# Patient Record
Sex: Male | Born: 1960 | Race: Black or African American | Hispanic: No | Marital: Single | Smoking: Current every day smoker
Health system: Southern US, Community
[De-identification: ages and names within clinical notes are randomized; demographics above are authoritative.]

## PROBLEM LIST (undated history)

## (undated) DIAGNOSIS — Z86010 Personal history of colonic polyps: Secondary | ICD-10-CM

## (undated) DIAGNOSIS — K219 Gastro-esophageal reflux disease without esophagitis: Secondary | ICD-10-CM

## (undated) DIAGNOSIS — I10 Essential (primary) hypertension: Secondary | ICD-10-CM

## (undated) DIAGNOSIS — K5790 Diverticulosis of intestine, part unspecified, without perforation or abscess without bleeding: Secondary | ICD-10-CM

## (undated) DIAGNOSIS — F329 Major depressive disorder, single episode, unspecified: Secondary | ICD-10-CM

## (undated) DIAGNOSIS — F32A Depression, unspecified: Secondary | ICD-10-CM

## (undated) HISTORY — DX: Gastro-esophageal reflux disease without esophagitis: K21.9

## (undated) HISTORY — DX: Personal history of colonic polyps: Z86.010

## (undated) HISTORY — PX: APPENDECTOMY: SHX54

## (undated) HISTORY — PX: OTHER SURGICAL HISTORY: SHX169

## (undated) HISTORY — DX: Major depressive disorder, single episode, unspecified: F32.9

## (undated) HISTORY — DX: Essential (primary) hypertension: I10

## (undated) HISTORY — DX: Diverticulosis of intestine, part unspecified, without perforation or abscess without bleeding: K57.90

## (undated) HISTORY — PX: COLON SURGERY: SHX602

## (undated) HISTORY — DX: Depression, unspecified: F32.A

---

## 1998-10-04 ENCOUNTER — Ambulatory Visit (HOSPITAL_BASED_OUTPATIENT_CLINIC_OR_DEPARTMENT_OTHER): Admission: RE | Admit: 1998-10-04 | Discharge: 1998-10-04 | Payer: Self-pay | Admitting: Orthopedic Surgery

## 1998-10-25 ENCOUNTER — Ambulatory Visit (HOSPITAL_BASED_OUTPATIENT_CLINIC_OR_DEPARTMENT_OTHER): Admission: RE | Admit: 1998-10-25 | Discharge: 1998-10-25 | Payer: Self-pay | Admitting: Orthopedic Surgery

## 1998-12-13 ENCOUNTER — Emergency Department (HOSPITAL_COMMUNITY): Admission: EM | Admit: 1998-12-13 | Discharge: 1998-12-13 | Payer: Self-pay | Admitting: Emergency Medicine

## 1999-08-01 ENCOUNTER — Other Ambulatory Visit: Admission: RE | Admit: 1999-08-01 | Discharge: 1999-08-01 | Payer: Self-pay | Admitting: *Deleted

## 1999-08-01 ENCOUNTER — Encounter (INDEPENDENT_AMBULATORY_CARE_PROVIDER_SITE_OTHER): Payer: Self-pay | Admitting: Specialist

## 2001-06-20 ENCOUNTER — Inpatient Hospital Stay (HOSPITAL_COMMUNITY): Admission: EM | Admit: 2001-06-20 | Discharge: 2001-06-27 | Payer: Self-pay | Admitting: Emergency Medicine

## 2001-06-20 ENCOUNTER — Encounter (INDEPENDENT_AMBULATORY_CARE_PROVIDER_SITE_OTHER): Payer: Self-pay | Admitting: *Deleted

## 2001-09-11 ENCOUNTER — Ambulatory Visit (HOSPITAL_COMMUNITY): Admission: RE | Admit: 2001-09-11 | Discharge: 2001-09-11 | Payer: Self-pay | Admitting: Surgery

## 2001-09-11 ENCOUNTER — Encounter: Payer: Self-pay | Admitting: Surgery

## 2001-10-07 ENCOUNTER — Encounter: Payer: Self-pay | Admitting: Surgery

## 2001-10-07 ENCOUNTER — Encounter: Admission: RE | Admit: 2001-10-07 | Discharge: 2001-10-25 | Payer: Self-pay | Admitting: Surgery

## 2001-10-14 ENCOUNTER — Inpatient Hospital Stay (HOSPITAL_COMMUNITY): Admission: RE | Admit: 2001-10-14 | Discharge: 2001-10-17 | Payer: Self-pay | Admitting: Surgery

## 2001-10-14 ENCOUNTER — Encounter (INDEPENDENT_AMBULATORY_CARE_PROVIDER_SITE_OTHER): Payer: Self-pay | Admitting: *Deleted

## 2003-06-08 ENCOUNTER — Emergency Department (HOSPITAL_COMMUNITY): Admission: EM | Admit: 2003-06-08 | Discharge: 2003-06-08 | Payer: Self-pay | Admitting: Emergency Medicine

## 2004-04-24 ENCOUNTER — Ambulatory Visit: Payer: Self-pay | Admitting: Internal Medicine

## 2004-04-30 ENCOUNTER — Ambulatory Visit: Payer: Self-pay | Admitting: Internal Medicine

## 2004-05-01 ENCOUNTER — Ambulatory Visit: Payer: Self-pay | Admitting: *Deleted

## 2004-05-16 ENCOUNTER — Ambulatory Visit: Payer: Self-pay | Admitting: Internal Medicine

## 2004-08-06 ENCOUNTER — Ambulatory Visit: Payer: Self-pay | Admitting: Internal Medicine

## 2004-09-19 ENCOUNTER — Ambulatory Visit: Payer: Self-pay | Admitting: Internal Medicine

## 2004-10-24 ENCOUNTER — Ambulatory Visit: Payer: Self-pay | Admitting: Internal Medicine

## 2005-04-14 ENCOUNTER — Encounter (INDEPENDENT_AMBULATORY_CARE_PROVIDER_SITE_OTHER): Payer: Self-pay | Admitting: Internal Medicine

## 2005-04-14 LAB — CONVERTED CEMR LAB: PSA: 0.36 ng/mL

## 2005-05-06 ENCOUNTER — Ambulatory Visit: Payer: Self-pay | Admitting: Internal Medicine

## 2005-06-04 ENCOUNTER — Ambulatory Visit: Payer: Self-pay | Admitting: Internal Medicine

## 2006-11-22 ENCOUNTER — Encounter (INDEPENDENT_AMBULATORY_CARE_PROVIDER_SITE_OTHER): Payer: Self-pay | Admitting: Internal Medicine

## 2006-11-22 DIAGNOSIS — Z8719 Personal history of other diseases of the digestive system: Secondary | ICD-10-CM

## 2006-11-22 DIAGNOSIS — K219 Gastro-esophageal reflux disease without esophagitis: Secondary | ICD-10-CM

## 2006-11-22 DIAGNOSIS — F17200 Nicotine dependence, unspecified, uncomplicated: Secondary | ICD-10-CM | POA: Insufficient documentation

## 2006-11-22 DIAGNOSIS — J309 Allergic rhinitis, unspecified: Secondary | ICD-10-CM | POA: Insufficient documentation

## 2006-11-22 DIAGNOSIS — F329 Major depressive disorder, single episode, unspecified: Secondary | ICD-10-CM

## 2007-12-04 ENCOUNTER — Ambulatory Visit: Payer: Self-pay | Admitting: Internal Medicine

## 2007-12-04 DIAGNOSIS — E782 Mixed hyperlipidemia: Secondary | ICD-10-CM

## 2007-12-04 DIAGNOSIS — R030 Elevated blood-pressure reading, without diagnosis of hypertension: Secondary | ICD-10-CM

## 2007-12-18 ENCOUNTER — Ambulatory Visit: Payer: Self-pay | Admitting: Internal Medicine

## 2007-12-18 LAB — CONVERTED CEMR LAB
Albumin: 4.5 g/dL (ref 3.5–5.2)
Alkaline Phosphatase: 44 units/L (ref 39–117)
BUN: 17 mg/dL (ref 6–23)
CO2: 22 meq/L (ref 19–32)
Calcium: 9.6 mg/dL (ref 8.4–10.5)
Cholesterol: 161 mg/dL (ref 0–200)
Eosinophils Absolute: 0.2 10*3/uL (ref 0.0–0.7)
Eosinophils Relative: 3 % (ref 0–5)
Glucose, Bld: 73 mg/dL (ref 70–99)
HCT: 46.4 % (ref 39.0–52.0)
HDL: 58 mg/dL (ref >39)
LDL Cholesterol: 91 mg/dL (ref 0–99)
Lymphocytes Relative: 37 % (ref 12–46)
Lymphs Abs: 2.6 10*3/uL (ref 0.7–4.0)
MCV: 97.5 fL (ref 78.0–100.0)
Monocytes Relative: 10 % (ref 3–12)
Neutrophils Relative %: 49 % (ref 43–77)
Potassium: 4.8 meq/L (ref 3.5–5.3)
RBC: 4.76 M/uL (ref 4.22–5.81)
Total Protein: 7 g/dL (ref 6.0–8.3)
Triglycerides: 61 mg/dL (ref <150)
WBC: 7.1 10*3/uL (ref 4.0–10.5)

## 2007-12-29 ENCOUNTER — Encounter (INDEPENDENT_AMBULATORY_CARE_PROVIDER_SITE_OTHER): Payer: Self-pay | Admitting: Internal Medicine

## 2008-01-11 ENCOUNTER — Telehealth (INDEPENDENT_AMBULATORY_CARE_PROVIDER_SITE_OTHER): Payer: Self-pay | Admitting: Internal Medicine

## 2008-01-21 ENCOUNTER — Ambulatory Visit: Payer: Self-pay | Admitting: Internal Medicine

## 2008-01-21 DIAGNOSIS — K5909 Other constipation: Secondary | ICD-10-CM | POA: Insufficient documentation

## 2008-01-21 DIAGNOSIS — F528 Other sexual dysfunction not due to a substance or known physiological condition: Secondary | ICD-10-CM

## 2008-01-21 LAB — CONVERTED CEMR LAB
Blood in Urine, dipstick: NEGATIVE
Ketones, urine, test strip: NEGATIVE
Nitrite: NEGATIVE
Protein, U semiquant: NEGATIVE
Urobilinogen, UA: 0.2
WBC Urine, dipstick: NEGATIVE

## 2008-02-03 ENCOUNTER — Telehealth (INDEPENDENT_AMBULATORY_CARE_PROVIDER_SITE_OTHER): Payer: Self-pay | Admitting: Internal Medicine

## 2008-03-08 ENCOUNTER — Telehealth (INDEPENDENT_AMBULATORY_CARE_PROVIDER_SITE_OTHER): Payer: Self-pay | Admitting: Internal Medicine

## 2008-03-22 ENCOUNTER — Ambulatory Visit: Payer: Self-pay | Admitting: Internal Medicine

## 2008-03-22 DIAGNOSIS — K089 Disorder of teeth and supporting structures, unspecified: Secondary | ICD-10-CM | POA: Insufficient documentation

## 2008-03-25 ENCOUNTER — Ambulatory Visit: Payer: Self-pay | Admitting: Internal Medicine

## 2008-04-15 ENCOUNTER — Telehealth (INDEPENDENT_AMBULATORY_CARE_PROVIDER_SITE_OTHER): Payer: Self-pay | Admitting: Internal Medicine

## 2008-05-12 ENCOUNTER — Ambulatory Visit: Payer: Self-pay | Admitting: Internal Medicine

## 2008-07-05 ENCOUNTER — Ambulatory Visit: Payer: Self-pay | Admitting: Internal Medicine

## 2008-10-06 ENCOUNTER — Telehealth (INDEPENDENT_AMBULATORY_CARE_PROVIDER_SITE_OTHER): Payer: Self-pay | Admitting: Internal Medicine

## 2008-10-11 ENCOUNTER — Telehealth (INDEPENDENT_AMBULATORY_CARE_PROVIDER_SITE_OTHER): Payer: Self-pay | Admitting: Internal Medicine

## 2008-10-11 ENCOUNTER — Emergency Department (HOSPITAL_COMMUNITY): Admission: EM | Admit: 2008-10-11 | Discharge: 2008-10-11 | Payer: Self-pay | Admitting: Emergency Medicine

## 2008-11-16 ENCOUNTER — Ambulatory Visit: Payer: Self-pay | Admitting: Internal Medicine

## 2008-11-16 DIAGNOSIS — H9209 Otalgia, unspecified ear: Secondary | ICD-10-CM | POA: Insufficient documentation

## 2008-11-18 ENCOUNTER — Telehealth (INDEPENDENT_AMBULATORY_CARE_PROVIDER_SITE_OTHER): Payer: Self-pay | Admitting: Internal Medicine

## 2008-12-02 ENCOUNTER — Ambulatory Visit: Payer: Self-pay | Admitting: Internal Medicine

## 2008-12-09 ENCOUNTER — Encounter (INDEPENDENT_AMBULATORY_CARE_PROVIDER_SITE_OTHER): Payer: Self-pay | Admitting: Internal Medicine

## 2009-02-01 ENCOUNTER — Ambulatory Visit: Payer: Self-pay | Admitting: Internal Medicine

## 2009-02-01 LAB — CONVERTED CEMR LAB
ALT: 15 units/L (ref 0–53)
Alkaline Phosphatase: 43 units/L (ref 39–117)
Basophils Absolute: 0.1 10*3/uL (ref 0.0–0.1)
Basophils Relative: 1 % (ref 0–1)
CO2: 20 meq/L (ref 19–32)
Cholesterol: 146 mg/dL (ref 0–200)
Creatinine, Ser: 1.19 mg/dL (ref 0.40–1.50)
Eosinophils Absolute: 0.1 10*3/uL (ref 0.0–0.7)
Glucose, Bld: 93 mg/dL (ref 70–99)
LDL Cholesterol: 73 mg/dL (ref 0–99)
MCHC: 33.8 g/dL (ref 30.0–36.0)
MCV: 96.8 fL (ref 78.0–100.0)
Monocytes Relative: 7 % (ref 3–12)
Neutro Abs: 3.8 10*3/uL (ref 1.7–7.7)
Neutrophils Relative %: 57 % (ref 43–77)
Nitrite: NEGATIVE
Protein, U semiquant: NEGATIVE
RBC: 4.7 M/uL (ref 4.22–5.81)
RDW: 13.6 % (ref 11.5–15.5)
Sodium: 142 meq/L (ref 135–145)
Total Bilirubin: 0.5 mg/dL (ref 0.3–1.2)
Total CHOL/HDL Ratio: 2.9
Triglycerides: 111 mg/dL (ref <150)
Urobilinogen, UA: 0.2
VLDL: 22 mg/dL (ref 0–40)
WBC Urine, dipstick: NEGATIVE
pH: 5.5

## 2009-02-14 ENCOUNTER — Encounter (INDEPENDENT_AMBULATORY_CARE_PROVIDER_SITE_OTHER): Payer: Self-pay | Admitting: Internal Medicine

## 2009-03-30 ENCOUNTER — Telehealth (INDEPENDENT_AMBULATORY_CARE_PROVIDER_SITE_OTHER): Payer: Self-pay | Admitting: Internal Medicine

## 2009-04-04 ENCOUNTER — Ambulatory Visit: Payer: Self-pay | Admitting: Internal Medicine

## 2009-04-18 ENCOUNTER — Telehealth (INDEPENDENT_AMBULATORY_CARE_PROVIDER_SITE_OTHER): Payer: Self-pay | Admitting: Internal Medicine

## 2009-04-20 ENCOUNTER — Ambulatory Visit: Payer: Self-pay | Admitting: Internal Medicine

## 2009-11-15 ENCOUNTER — Ambulatory Visit: Payer: Self-pay | Admitting: Internal Medicine

## 2009-11-15 DIAGNOSIS — R5381 Other malaise: Secondary | ICD-10-CM

## 2009-11-15 DIAGNOSIS — R5383 Other fatigue: Secondary | ICD-10-CM

## 2009-11-15 LAB — CONVERTED CEMR LAB
Blood in Urine, dipstick: NEGATIVE
Nitrite: NEGATIVE
Protein, U semiquant: NEGATIVE
Specific Gravity, Urine: 1.025
Urobilinogen, UA: 0.2

## 2009-11-23 ENCOUNTER — Telehealth (INDEPENDENT_AMBULATORY_CARE_PROVIDER_SITE_OTHER): Payer: Self-pay | Admitting: Internal Medicine

## 2009-11-23 LAB — CONVERTED CEMR LAB
BUN: 25 mg/dL — ABNORMAL HIGH (ref 6–23)
Basophils Relative: 1 % (ref 0–1)
CO2: 21 meq/L (ref 19–32)
Eosinophils Absolute: 0.1 10*3/uL (ref 0.0–0.7)
Eosinophils Relative: 1 % (ref 0–5)
Glucose, Bld: 79 mg/dL (ref 70–99)
HCT: 48.5 % (ref 39.0–52.0)
Hemoglobin: 16.8 g/dL (ref 13.0–17.0)
MCHC: 34.6 g/dL (ref 30.0–36.0)
MCV: 96.4 fL (ref 78.0–100.0)
Monocytes Absolute: 0.7 10*3/uL (ref 0.1–1.0)
Monocytes Relative: 9 % (ref 3–12)
Neutro Abs: 4.4 10*3/uL (ref 1.7–7.7)
RBC: 5.03 M/uL (ref 4.22–5.81)
Sodium: 137 meq/L (ref 135–145)
Testosterone: 284.69 ng/dL — ABNORMAL LOW (ref 350–890)
Total Bilirubin: 0.4 mg/dL (ref 0.3–1.2)
Total Protein: 7.2 g/dL (ref 6.0–8.3)

## 2009-11-24 ENCOUNTER — Telehealth (INDEPENDENT_AMBULATORY_CARE_PROVIDER_SITE_OTHER): Payer: Self-pay | Admitting: Internal Medicine

## 2009-12-01 ENCOUNTER — Telehealth (INDEPENDENT_AMBULATORY_CARE_PROVIDER_SITE_OTHER): Payer: Self-pay | Admitting: Internal Medicine

## 2010-01-30 ENCOUNTER — Ambulatory Visit: Payer: Self-pay | Admitting: Internal Medicine

## 2010-03-17 ENCOUNTER — Emergency Department (HOSPITAL_COMMUNITY): Admission: EM | Admit: 2010-03-17 | Discharge: 2010-03-18 | Payer: Self-pay | Admitting: Emergency Medicine

## 2010-03-22 ENCOUNTER — Telehealth (INDEPENDENT_AMBULATORY_CARE_PROVIDER_SITE_OTHER): Payer: Self-pay | Admitting: Internal Medicine

## 2010-04-02 ENCOUNTER — Ambulatory Visit: Payer: Self-pay | Admitting: Internal Medicine

## 2010-04-02 DIAGNOSIS — H6690 Otitis media, unspecified, unspecified ear: Secondary | ICD-10-CM | POA: Insufficient documentation

## 2010-04-09 ENCOUNTER — Ambulatory Visit: Payer: Self-pay | Admitting: Internal Medicine

## 2010-04-17 ENCOUNTER — Ambulatory Visit: Payer: Self-pay | Admitting: Nurse Practitioner

## 2010-04-17 ENCOUNTER — Telehealth (INDEPENDENT_AMBULATORY_CARE_PROVIDER_SITE_OTHER): Payer: Self-pay | Admitting: Internal Medicine

## 2010-04-23 ENCOUNTER — Encounter (INDEPENDENT_AMBULATORY_CARE_PROVIDER_SITE_OTHER): Payer: Self-pay | Admitting: Internal Medicine

## 2010-05-03 ENCOUNTER — Telehealth (INDEPENDENT_AMBULATORY_CARE_PROVIDER_SITE_OTHER): Payer: Self-pay | Admitting: Internal Medicine

## 2010-05-30 ENCOUNTER — Ambulatory Visit: Payer: Self-pay | Admitting: Internal Medicine

## 2010-05-30 LAB — CONVERTED CEMR LAB
Basophils Absolute: 0.1 10*3/uL (ref 0.0–0.1)
Cholesterol: 146 mg/dL (ref 0–200)
HDL: 45 mg/dL (ref >39)
Hemoglobin: 14.2 g/dL (ref 13.0–17.0)
Lymphocytes Relative: 36 % (ref 12–46)
Monocytes Absolute: 0.5 10*3/uL (ref 0.1–1.0)
Neutro Abs: 3.8 10*3/uL (ref 1.7–7.7)
Platelets: 240 10*3/uL (ref 150–400)
RDW: 14.4 % (ref 11.5–15.5)
Total CHOL/HDL Ratio: 3.2

## 2010-06-13 ENCOUNTER — Telehealth (INDEPENDENT_AMBULATORY_CARE_PROVIDER_SITE_OTHER): Payer: Self-pay | Admitting: Internal Medicine

## 2010-06-19 ENCOUNTER — Encounter (INDEPENDENT_AMBULATORY_CARE_PROVIDER_SITE_OTHER): Payer: Self-pay | Admitting: *Deleted

## 2010-07-23 ENCOUNTER — Ambulatory Visit: Admit: 2010-07-23 | Payer: Self-pay | Admitting: Internal Medicine

## 2010-08-14 NOTE — Progress Notes (Signed)
Summary: Wants colonoscopy referral  Phone Note Call from Patient   Summary of Call: Would like a referral for a colonoscopy. Initial call taken by: Dutch Quint RN,  April 17, 2010 12:21 PM  Follow-up for Phone Call        Nora--see CPE in July and referral letter to send. Follow-up by: Julieanne Manson MD,  April 23, 2010 10:11 AM  Additional Follow-up for Phone Call Additional follow up Details #1::        I SEND THE REFERRAL TO [facility] WAITING FOR AN APPT  Additional Follow-up by: Cheryll Dessert,  April 26, 2010 8:29 AM

## 2010-08-14 NOTE — Assessment & Plan Note (Signed)
Summary: FU FOR RIGHT EAR///[place]  Nurse Visit   Vital Signs:  Patient profile:   49 year old male Pulse rate:   96 / minute Pulse rhythm:   regular Resp:     20 per minute BP sitting:   110 / 84  (left arm) Cuff size:   regular  Vitals Entered By: Dutch Quint RN (April 17, 2010 10:07 AM)  Patient Instructions: 1)  Seen by Dr. Delrae Alfred. 2)  Your inner ears are very dry, but there is no infection. 3)  Use a 50-50 mixture of white vinegar and rubbing alcohol and take 4 drops and put it in your ear canals.  After it's been in for a couple of minutes, let it drain out. 4)  Don't put anything in your ears!! 5)  The fluid in your ear will slowly go away.  Your ears look much better. 6)  Call if anything changes or if you have questions.   Review of Systems ENT:  Complains of decreased hearing; denies difficulty swallowing, ear discharge, earache, hoarseness, nasal congestion, nosebleeds, postnasal drainage, ringing in ears, sinus pressure, and sore throat.   Physical Exam  Ears:  right ear still has some erythema to canal and TM, some residual fluid -- TM is visualized better since last visit.   L ear normal.     Impression & Recommendations:  Problem # 1:  OTITIS MEDIA, ACUTE, RIGHT (ICD-382.9) Right ear looks much better, left ear normal Completed antibiotics, still taking allergy meds Instructed not to put things in his ear -- to use 50/50 mixture of white vinegar and rubbing alcohol, 4 drops in each ear and let it drain.  His updated medication list for this problem includes:    Ibuprofen 800 Mg Tabs (Ibuprofen) .Marland Kitchen... Take one by mouth with food every 8 hours as needed for pain  Complete Medication List: 1)  Amitriptyline Hcl 50 Mg Tabs (Amitriptyline hcl) .Marland Kitchen.. 1 tab by mouth q hs 2)  Protonix 40 Mg Tbec (Pantoprazole sodium) .Marland Kitchen.. 1tpoqd for esophageal reflux 3)  Miralax Powd (Polyethylene glycol 3350) .Marland KitchenMarland KitchenMarland Kitchen 17 g in 8 oz fluid daily--may decrease frequency of use and  amount used if stools become loose or watery 4)  Nasacort Aq 55 Mcg/act Aers (Triamcinolone acetonide(nasal)) .... 2 sprays each nostril daily 5)  Singulair 10 Mg Tabs (Montelukast sodium) .Marland Kitchen.. 1 tab by mouth daily 6)  Multivitamins Tabs (Multiple vitamin) .Marland Kitchen.. 1 tab by mouth daily 7)  Xyzal 5 Mg Tabs (Levocetirizine dihydrochloride) .Marland Kitchen.. 1 tab by mouth daily 8)  Astelin 137 Mcg/spray Soln (Azelastine hcl) .... 2 sprays each nostril two times a day 9)  Viagra 100 Mg Tabs (Sildenafil citrate) .Marland Kitchen.. 1 tab 1 hour before activity as needed.  do not exceed one tablet in 24 hour period. 10)  Androgel 50 Mg/5gm Gel (Testosterone) .... 5g to shoulders, upper arms or abdomen changed daily 11)  Ibuprofen 800 Mg Tabs (Ibuprofen) .... Take one by mouth with food every 8 hours as needed for pain  CC: R ear check s/p infection Is Patient Diabetic? No Pain Assessment Patient in pain? no        Primary Care Provider:  Tereso Newcomer, PA-C  CC:  R ear check s/p infection.  History of Present Illness: In office 9/19 for right ear infection, has been on antibiotics and allergy medication.  Still feeling like right ear is still clogged.  Still taking allergy medication.   Allergies: No Known Drug Allergies  Orders Added: 1)  Est. Patient Level II [04540]

## 2010-08-14 NOTE — Progress Notes (Signed)
Summary: Query for meds, wants lab results  Phone Note Call from Patient Call back at Home Phone [phone] Call back at [phone]   Call For: [phone] Summary of Call: [facility] do not carry astelin and the pt wants to know if the provider can prescribed something else that our pharmacy has because he doesn't has the money to pay outside pharmacy. Also the pt wants to know if the provider can mail his lab results. Jason Marucci MD Initial call taken by: Manon Hilding,  Dec 01, 2009 9:14 AM  Follow-up for Phone Call        He already receives the Nasocort, Xyzal, and Singulair--if we do not carry the Astelin, not another med to offer. PLease  send lab results--just print and send to patient Follow-up by: Julieanne Manson MD,  Dec 04, 2009 8:56 AM  Additional Follow-up for Phone Call Additional follow up Details #1::        Spoke with pt. and advised of provider's response.  Address confirmed -- lab results mailed to pt.  Additional Follow-up by: Dutch Quint RN,  Dec 07, 2009 11:44 AM

## 2010-08-14 NOTE — Progress Notes (Signed)
Summary: Office Visit//DEPRESSION SCREENING  Office Visit//DEPRESSION SCREENING   Imported By: Arta Bruce 01/11/2010 12:37:46  _____________________________________________________________________  External Attachment:    Type:   Image     Comment:   External Document

## 2010-08-14 NOTE — Progress Notes (Signed)
Summary: GI REFERRAL   Phone Note Outgoing Call   Summary of Call: I JUST WANT TO LET YOU KNOW THATYOUR PT WAS REFERRAL TO  TO GI  AND [facility] IS ON CALL THESE MONTH  I CALL TO SCHEDULE AN APPT PT HAVE A BALANCE WITH THEM SO I SEND TO [facility] AND CAN'T BE SEEN THERE BECAUSE THEY DON'T HAVE A MEDICAL NECCESITY IS JUST A PREVENTIVE HEALTH CARE AND MY NEXT STEP IS WAITING NEXT MONTH THAT [facility] WILL BE ON CALL TO TRY TO SCHEDULE THANK YOU  Initial call taken by: Cheryll Dessert,  May 03, 2010 3:42 PM  Follow-up for Phone Call        that's fine Follow-up by: Julieanne Manson MD,  May 03, 2010 11:39 PM

## 2010-08-14 NOTE — Letter (Signed)
Summary: Pre Visit Letter Revised  [facility]  [address] [place], [place] [address]   Phone: [phone]  Fax: [phone]        06/19/2010 MRN: [id number] Jason Fritz [address] [place], [place]  [address]             Procedure Date:  08-06-10   Welcome to the Gastroenterology Division at [facility].    You are scheduled to see a nurse for your pre-procedure visit on 07-23-10 at 11:00a.m. on the 3rd floor at [facility], [address]  We ask that you try to arrive at our office 15 minutes prior to your appointment time to allow for check-in.  Please take a minute to review the attached form.  If you answer "Yes" to one or more of the questions on the first page, we ask that you call the person listed at your earliest opportunity.  If you answer "No" to all of the questions, please complete the rest of the form and bring it to your appointment.    Your nurse visit will consist of discussing your medical and surgical history, your immediate family medical history, and your medications.   If you are unable to list all of your medications on the form, please bring the medication bottles to your appointment and we will list them.  We will need to be aware of both prescribed and over the counter drugs.  We will need to know exact dosage information as well.    Please be prepared to read and sign documents such as consent forms, a financial agreement, and acknowledgement forms.  If necessary, and with your consent, a friend or relative is welcome to sit-in on the nurse visit with you.  Please bring your insurance card so that we may make a copy of it.  If your insurance requires a referral to see a specialist, please bring your referral form from your primary care physician.  No co-pay is required for this nurse visit.     If you cannot keep your appointment, please call [phone] to cancel or reschedule prior to your appointment date.  This allows  [place] the opportunity to schedule an appointment for another patient in need of care.    Thank you for choosing [facility] for your medical needs.  We appreciate the opportunity to care for you.  Please visit [place] at our website  to learn more about our practice.  Sincerely, The Gastroenterology Division

## 2010-08-14 NOTE — Progress Notes (Signed)
  Phone Note Other Incoming   Summary of Call: No Astelin available at [facility] Initial call taken by: Julieanne Manson MD,  Nov 23, 2009 7:26 AM

## 2010-08-14 NOTE — Letter (Signed)
Summary: *Referral Letter  [facility]  [address] [place], [place] [address]   Phone: [phone]  Fax: [phone]    04/23/2010  Thank you in advance for agreeing to see my patient:  Jason Fritz [address] [place], [place]  [address]  Phone: [phone]  Reason for Referral: Pt recently turned 49.  Will enclose CPE from July with this letter.  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) 3)  FATIGUE (ICD-780.79) 4)  EAR PAIN, RIGHT (ICD-388.70) 5)  DENTAL DISORDER (ICD-525.9) 6)  CONSTIPATION, CHRONIC (ICD-564.09) 7)  ERECTILE DYSFUNCTION (ICD-302.72) 8)  HEALTH MAINTENANCE EXAM (ICD-V70.0) 9)  HYPERLIPIDEMIA, MIXED (ICD-272.2) 10)  ELEVATED BLOOD PRESSURE WITHOUT DIAGNOSIS OF HYPERTENSION (ICD-796.2) 11)  TOBACCO USER (ICD-305.1) 12)  GERD (ICD-530.81) 13)  DIVERTICULITIS, HX OF (ICD-V12.79) 14)  DEPRESSION (ICD-311) 15)  ALLERGIC RHINITIS (ICD-477.9)   Current Medications: 1)  AMITRIPTYLINE HCL 50 MG TABS (AMITRIPTYLINE HCL) 1 tab by mouth q hs 2)  PROTONIX 40 MG TBEC (PANTOPRAZOLE SODIUM) 1tpoqd for esophageal reflux 3)  MIRALAX   POWD (POLYETHYLENE GLYCOL 3350) 17 g in 8 oz fluid daily--may decrease frequency of use and amount used if stools become loose or watery 4)  NASACORT AQ 55 MCG/ACT AERS (TRIAMCINOLONE ACETONIDE(NASAL)) 2 sprays each nostril daily 5)  SINGULAIR 10 MG TABS (MONTELUKAST SODIUM) 1 tab by mouth daily 6)  MULTIVITAMINS  TABS (MULTIPLE VITAMIN) 1 tab by mouth daily 7)  XYZAL 5 MG TABS (LEVOCETIRIZINE DIHYDROCHLORIDE) 1 tab by mouth daily 8)  ASTELIN 137 MCG/SPRAY SOLN (AZELASTINE HCL) 2 sprays each nostril two times a day 9)  VIAGRA 100 MG TABS (SILDENAFIL CITRATE) 1 tab 1 hour before activity as needed.  Do not exceed one tablet in 24 hour period. 10)  ANDROGEL 50 MG/5GM GEL (TESTOSTERONE) 5g  to shoulders, upper arms or abdomen changed daily 11)  IBUPROFEN 800 MG TABS (IBUPROFEN) Take one by mouth with food every 8 hours as needed for pain   Past Medical History: 1)  EAR PAIN, RIGHT (ICD-388.70) 2)  DENTAL DISORDER (ICD-525.9) 3)  CONSTIPATION, CHRONIC (ICD-564.09) 4)  ERECTILE DYSFUNCTION (ICD-302.72) 5)  HEALTH MAINTENANCE EXAM (ICD-V70.0) 6)  HYPERLIPIDEMIA, MIXED (ICD-272.2) 7)  ELEVATED BLOOD PRESSURE WITHOUT DIAGNOSIS OF HYPERTENSION (ICD-796.2) 8)  TOBACCO USER (ICD-305.1) 9)  GERD (ICD-530.81) 10)  DIVERTICULITIS, HX OF (ICD-V12.79) 11)  DEPRESSION (ICD-311) 12)  ALLERGIC RHINITIS (ICD-477.9) 13)      Prior History of Blood Transfusions:   Pertinent Labs:    Thank you again for agreeing to see our patient; please contact us if you have any further questions or need additional information.  Sincerely,  Julieanne Manson MD

## 2010-08-14 NOTE — Assessment & Plan Note (Signed)
Summary: Right Otitis Media   Vital Signs:  Patient profile:   50 year old male Height:      69.75 inches Weight:      175 pounds BMI:     25.38 Temp:     98.4 degrees F oral Pulse rate:   88 / minute Pulse rhythm:   regular Resp:     18 per minute BP sitting:   140 / 96  (left arm) Cuff size:   large  Vitals Entered By: [place] Shannon (April 02, 2010 3:19 PM) CC: pt is here for ear pain.. pt says his left ear was hurting first and now its his right ear... Is Patient Diabetic? No Pain Assessment Patient in pain? no       Does patient need assistance? Functional Status Self care Ambulation Normal   Primary Care Provider:  Tereso Newcomer, PA-C  CC:  pt is here for ear pain.. pt says his left ear was hurting first and now its his right ear....  History of Present Illness: C/o ear pain.  Went to ED for left ear.  Pain in left feels better.  Now has right ear pain.  Used home remedy of vinegar/isopropyl alcohol and seemed to get better on our suggestion.  Left ear all better now.  Right ear pain started yesterday.  No fever.  Notes nasal congestion.  No sore throat.  No cough.  No dypsnea.  No vomiting or diarrhea.  Pain is quite severe.   Problems Prior to Update: 1)  Low Testosterone  (ICD-257.2) 2)  Fatigue  (ICD-780.79) 3)  Ear Pain, Right  (ICD-388.70) 4)  Dental Disorder  (ICD-525.9) 5)  Constipation, Chronic  (ICD-564.09) 6)  Erectile Dysfunction  (ICD-302.72) 7)  Health Maintenance Exam  (ICD-V70.0) 8)  Hyperlipidemia, Mixed  (ICD-272.2) 9)  Elevated Blood Pressure Without Diagnosis of Hypertension  (ICD-796.2) 10)  Tobacco User  (ICD-305.1) 11)  Gerd  (ICD-530.81) 12)  Diverticulitis, Hx of  (ICD-V12.79) 13)  Depression  (ICD-311) 14)  Allergic Rhinitis  (ICD-477.9)  Current Medications (verified): 1)  Amitriptyline Hcl 50 Mg Tabs (Amitriptyline Hcl) .Marland Kitchen.. 1 Tab By Mouth Q Hs 2)  Protonix 40 Mg Tbec (Pantoprazole Sodium) .Marland Kitchen.. 1tpoqd For Esophageal  Reflux 3)  Miralax   Powd (Polyethylene Glycol 3350) .Marland KitchenMarland KitchenMarland Kitchen 17 G in 8 Oz Fluid Daily--May Decrease Frequency of Use and Amount Used If Stools Become Loose or Watery 4)  Nasacort Aq 55 Mcg/act Aers (Triamcinolone Acetonide(Nasal)) .... 2 Sprays Each Nostril Daily 5)  Singulair 10 Mg Tabs (Montelukast Sodium) .Marland Kitchen.. 1 Tab By Mouth Daily 6)  Multivitamins  Tabs (Multiple Vitamin) .Marland Kitchen.. 1 Tab By Mouth Daily 7)  Xyzal 5 Mg Tabs (Levocetirizine Dihydrochloride) .Marland Kitchen.. 1 Tab By Mouth Daily 8)  Astelin 137 Mcg/spray Soln (Azelastine Hcl) .... 2 Sprays Each Nostril Two Times A Day 9)  Viagra 100 Mg Tabs (Sildenafil Citrate) .Marland Kitchen.. 1 Tab 1 Hour Before Activity As Needed.  Do Not Exceed One Tablet in 24 Hour Period. 10)  Androgel 50 Mg/5gm Gel (Testosterone) .... 5g To Shoulders, Upper Arms or Abdomen Changed Daily  Allergies (verified): No Known Drug Allergies  Past History:  Past Medical History: Last updated: 11/15/2009 EAR PAIN, RIGHT (ICD-388.70) DENTAL DISORDER (ICD-525.9) CONSTIPATION, CHRONIC (ICD-564.09) ERECTILE DYSFUNCTION (ICD-302.72) HEALTH MAINTENANCE EXAM (ICD-V70.0) HYPERLIPIDEMIA, MIXED (ICD-272.2) ELEVATED BLOOD PRESSURE WITHOUT DIAGNOSIS OF HYPERTENSION (ICD-796.2) TOBACCO USER (ICD-305.1) GERD (ICD-530.81) DIVERTICULITIS, HX OF (ICD-V12.79) DEPRESSION (ICD-311) ALLERGIC RHINITIS (ICD-477.9)    Physical Exam  General:  alert, well-developed, and  well-nourished.   Head:  normocephalic and atraumatic.   Eyes:  pupils equal, pupils round, pupils reactive to light, and no injection.   Ears:  L TM with some scarring; retracted and translucent R TM - bulging, yellow, boggy no pain with pressure over tragus bilat canals appear normal bilat Nose:  no external deformity.   Mouth:  pharynx pink and moist and no exudates.   Lungs:  normal breath sounds.   Heart:  normal rate and regular rhythm.   Neurologic:  alert & oriented X3 and cranial nerves II-XII intact.   Cervical Nodes:  no  anterior cervical adenopathy and no posterior cervical adenopathy.   does have tenderness over preauricular and post auricular nodes on right Psych:  normally interactive.     Impression & Recommendations:  Problem # 1:  OTITIS MEDIA, ACUTE, RIGHT (ICD-382.9)  looks pretty severe augmentin x 10 days continue nasal steroid afrin x 3 days ibuprofen  f/u in one week  His updated medication list for this problem includes:    Augmentin 875-125 Mg Tabs (Amoxicillin-pot clavulanate) .Marland Kitchen... Take 1 tablet by mouth two times a day for 10 days    Ibuprofen 800 Mg Tabs (Ibuprofen) .Marland Kitchen... Take one by mouth with food every 8 hours as needed for pain  Complete Medication List: 1)  Amitriptyline Hcl 50 Mg Tabs (Amitriptyline hcl) .Marland Kitchen.. 1 tab by mouth q hs 2)  Protonix 40 Mg Tbec (Pantoprazole sodium) .Marland Kitchen.. 1tpoqd for esophageal reflux 3)  Miralax Powd (Polyethylene glycol 3350) .Marland KitchenMarland KitchenMarland Kitchen 17 g in 8 oz fluid daily--may decrease frequency of use and amount used if stools become loose or watery 4)  Nasacort Aq 55 Mcg/act Aers (Triamcinolone acetonide(nasal)) .... 2 sprays each nostril daily 5)  Singulair 10 Mg Tabs (Montelukast sodium) .Marland Kitchen.. 1 tab by mouth daily 6)  Multivitamins Tabs (Multiple vitamin) .Marland Kitchen.. 1 tab by mouth daily 7)  Xyzal 5 Mg Tabs (Levocetirizine dihydrochloride) .Marland Kitchen.. 1 tab by mouth daily 8)  Astelin 137 Mcg/spray Soln (Azelastine hcl) .... 2 sprays each nostril two times a day 9)  Viagra 100 Mg Tabs (Sildenafil citrate) .Marland Kitchen.. 1 tab 1 hour before activity as needed.  do not exceed one tablet in 24 hour period. 10)  Androgel 50 Mg/5gm Gel (Testosterone) .... 5g to shoulders, upper arms or abdomen changed daily 11)  Augmentin 875-125 Mg Tabs (Amoxicillin-pot clavulanate) .... Take 1 tablet by mouth two times a day for 10 days 12)  Ibuprofen 800 Mg Tabs (Ibuprofen) .... Take one by mouth with food every 8 hours as needed for pain  Patient Instructions: 1)  Take the Augmentin until all gone. 2)   Eat some yogurt 2-3 x a week while taking Augmentin. 3)  Use Afrin (you can get over the counter) 1 spray two times a day for 3 days only.  Do not use more than 3 days. 4)  Do not use Afrin at same time as Nasacort. 5)  Use Ibuprofen with food three times a day for 3 days.  After this, you can use it every 8 hours as needed for pain. 6)  Make sure you take Ibuprofen with food. 7)  Schedule follow up in one week with Lorin Picket or Dr. Delrae Alfred for recheck on ear. Prescriptions: IBUPROFEN 800 MG TABS (IBUPROFEN) Take one by mouth with food every 8 hours as needed for pain  #30 x 1   Entered and Authorized by:   Tereso Newcomer PA-C   Signed by:   Tereso Newcomer PA-C on 04/02/2010  Method used:   Print then Give to Patient   RxID:   [id number] AUGMENTIN 875-125 MG TABS (AMOXICILLIN-POT CLAVULANATE) Take 1 tablet by mouth two times a day for 10 days  #20 x 0   Entered and Authorized by:   Tereso Newcomer PA-C   Signed by:   Tereso Newcomer PA-C on 04/02/2010   Method used:   Print then Give to Patient   RxID:   [id number]

## 2010-08-14 NOTE — Progress Notes (Signed)
Summary: WANTS LAB RESULTS  Phone Note Call from Patient Call back at Home Phone [phone]   Reason for Call: Lab or Test Results Summary of Call: MULBERRY PT. MR Carriero CALLED BECAUSE HE WOULD LIKE TO KNOW HIS LAB RESULTS FROM 11/16 Initial call taken by: Leodis Rains,  June 13, 2010 11:55 AM  Follow-up for Phone Call        Sent to [facility] [place].  Dutch Quint RN  June 13, 2010 11:57 AM   Additional Follow-up for Phone Call Additional follow up Details #1::        Please let him know his labs were okay--testosterone now in a normal range.   His triglycerides are a bit higher --needs to keep an eye on what he is eating--avoid fried, fatty foods.  Eat lots of veggies and fruits. Schedule for recheck of CBC, PSA, FLP, CMET in 4 months and an appt. with me to follow a couple of days later--I was out the last time he was to follow up with me and appears he did not reschedule.  I need to see him, however, following above labs. Additional Follow-up by: Julieanne Manson MD,  June 14, 2010 3:39 PM    Additional Follow-up for Phone Call Additional follow up Details #2::    Pt. notified, appt. scheduled. Pt. request a copy of labs be mailed to him, done. Follow-up by: Gaylyn Cheers RN,  June 15, 2010 4:18 PM  Prescriptions: ANDROGEL 50 MG/5GM GEL (TESTOSTERONE) 5g to shoulders, upper arms or abdomen changed daily  #1 month supp x 4   Entered and Authorized by:   Julieanne Manson MD   Signed by:   Julieanne Manson MD on 06/14/2010   Method used:   Printed then faxed to ...       [facility] (retail)       [address] [place], [place]  [address]       Ph: [phone]       Fax: [phone]   RxID:   [id number]

## 2010-08-14 NOTE — Progress Notes (Signed)
Summary: EAR INFECTION -- wants refill  Phone Note Call from Patient Call back at Home Phone [phone]   Reason for Call: Refill Medication Summary of Call: Jason Fritz PT. MR Jason Fritz SAYS THAT HE WENT TO [facility] ON SEPT. 3 FOR EAR INFECTION AND THEY GAVE HIM SOME EAR DROPS BEFORE HE LEFT BUT DIDN'T GIVE HIM A RX AND NOW HE IS OUT AND THE INFECTION IS NOT QUITE GONE AND NEEDS A REFILL ON IT. THE NAME OF TRHE DROP IS  CIPROFLOXACIN/DEXAMETHASONE 7.5 .  HE WANTS TO KNOW IF YOU CAN CALL IT INTO [facility] ON [address]. HIS ELIGIBILITY HAS EXPIRED AND HIS APPT. TO RECERTIFY IS 10/03 Initial call taken by: Leodis Rains,  March 22, 2010 10:01 AM  Follow-up for Phone Call        Sent to [facility].  Dutch Quint RN  March 22, 2010 11:48 AM   Additional Follow-up for Phone Call Additional follow up Details #1::        Did he take ciprodex for 7 days? Is his pain improved?  Additional Follow-up by: Tereso Newcomer PA-C,  March 22, 2010 1:33 PM    Additional Follow-up for Phone Call Additional follow up Details #2::    [name]  March 22, 2010 3:25 PM   Would need to see him to evaluate his ear before deciding on renewing antibiotics. He can use a mixture of white vinegar and isopropyl (rubbing) alcohol. He mixes it 1:1.  So, he can combine 1 tsp of each to a bowl and instil 2-4 drops in the affected ear three times a day for 5-7 days.  Will need to make up new mixture each time (alcohol evaporates quickly). Go to urgent care if getting worse or schedule appt here. Tereso Newcomer PA-C  March 22, 2010 3:51 PM   Additional Follow-up for Phone Call Additional follow up Details #3:: Details for Additional Follow-up Action Taken: Left message on answering machine for pt to call back.Marland KitchenMarland KitchenMarland KitchenArmenia Shannon   March 22, 2010 4:24 PM  pt is aware.... [place] Shannon  March 22, 2010 4:57 PM

## 2010-08-14 NOTE — Assessment & Plan Note (Signed)
Summary: Recheck ears  Nurse Visit   Vital Signs:  Patient profile:   50 year old male Temp:     97.6 degrees F oral Pulse rate:   92 / minute Pulse rhythm:   regular Resp:     20 per minute BP sitting:   122 / 96  (left arm) Cuff size:   regular  Vitals Entered By: Dutch Quint RN (April 09, 2010 12:08 PM)  Impression & Recommendations:  Problem # 1:  OTITIS MEDIA, ACUTE, RIGHT (ICD-382.9) Right ear still has fluid but looks good Take all antbiotics until complete Take both allergy meds until seen next by provider Advised to keep foreign objects out of the ears F/U with triage nurse in one week.  His updated medication list for this problem includes:    Augmentin 875-125 Mg Tabs (Amoxicillin-pot clavulanate) .Marland Kitchen... Take 1 tablet by mouth two times a day for 10 days    Ibuprofen 800 Mg Tabs (Ibuprofen) .Marland Kitchen... Take one by mouth with food every 8 hours as needed for pain  Complete Medication List: 1)  Amitriptyline Hcl 50 Mg Tabs (Amitriptyline hcl) .Marland Kitchen.. 1 tab by mouth q hs 2)  Protonix 40 Mg Tbec (Pantoprazole sodium) .Marland Kitchen.. 1tpoqd for esophageal reflux 3)  Miralax Powd (Polyethylene glycol 3350) .Marland KitchenMarland KitchenMarland Kitchen 17 g in 8 oz fluid daily--may decrease frequency of use and amount used if stools become loose or watery 4)  Nasacort Aq 55 Mcg/act Aers (Triamcinolone acetonide(nasal)) .... 2 sprays each nostril daily 5)  Singulair 10 Mg Tabs (Montelukast sodium) .Marland Kitchen.. 1 tab by mouth daily 6)  Multivitamins Tabs (Multiple vitamin) .Marland Kitchen.. 1 tab by mouth daily 7)  Xyzal 5 Mg Tabs (Levocetirizine dihydrochloride) .Marland Kitchen.. 1 tab by mouth daily 8)  Astelin 137 Mcg/spray Soln (Azelastine hcl) .... 2 sprays each nostril two times a day 9)  Viagra 100 Mg Tabs (Sildenafil citrate) .Marland Kitchen.. 1 tab 1 hour before activity as needed.  do not exceed one tablet in 24 hour period. 10)  Androgel 50 Mg/5gm Gel (Testosterone) .... 5g to shoulders, upper arms or abdomen changed daily 11)  Augmentin 875-125 Mg Tabs  (Amoxicillin-pot clavulanate) .... Take 1 tablet by mouth two times a day for 10 days 12)  Ibuprofen 800 Mg Tabs (Ibuprofen) .... Take one by mouth with food every 8 hours as needed for pain   Patient Instructions: 1)  Seen by Jesse Fall, FNP. 2)  Right ear still has fluid, but looks good. 3)  Take all of the antibiotic until it's done. 4)  Take your Xyzal every day.  Take both allergy medicines until you come back to see your provider. 5)  Don't put anything in your ear! 6)  Make follow-up appointment with triage nurse on Tuesday, October  4th. 7)  Call if anything changes for the worse or you have any questions.   Primary Care Provider:  Tereso Newcomer, PA-C  CC:  F/U right ear infection.  History of Present Illness: Saw provider on 9/21 for right ear infection.  Still taking antibiotic, no adverse effects.  States he can hear clearly out of left ear, not as well out of the right.   Review of Systems ENT:  Complains of decreased hearing and nasal congestion; denies difficulty swallowing, ear discharge, earache, hoarseness, nosebleeds, postnasal drainage, ringing in ears, sinus pressure, and sore throat.   Physical Exam  General:  alert, well-developed, well-nourished, and well-hydrated.   Ears:  R canal inflamed, R decreased hearing, and L TM erythema.   Lungs:  normal respiratory effort, normal breath sounds, no crackles, and no wheezes.   Heart:  normal rate and regular rhythm.    CC: F/U right ear infection Is Patient Diabetic? No Pain Assessment Patient in pain? no       Does patient need assistance? Functional Status Self care Ambulation Normal   Allergies: No Known Drug Allergies  Orders Added: 1)  Est. Patient Level I [16109]

## 2010-08-14 NOTE — Progress Notes (Signed)
Summary: CAN HE GET MEDS FOR TESTOSTERONE  Phone Note Call from Patient Call back at Home Phone [phone]   Reason for Call: Talk to Doctor Summary of Call: Carly Applegate PT. MR Harris WANTS TO KNOW SINCE HIS TESTERTRONE IS LOW CAN YOU PRESECRIBE SOMETHING FOR HM. HE USE [facility]. Initial call taken by: Leodis Rains,  Nov 24, 2009 11:14 AM  Follow-up for Phone Call        Sent to Dr. Delrae Alfred -- last OV was 11/15/09.  Dutch Quint RN  Nov 28, 2009 5:24 PM   Additional Follow-up for Phone Call Additional follow up Details #1::        Please see append to labs--pt. was in agreement I thought to discuss treatment at follow up on June 16.  Need to discuss side effects, etc. Additional Follow-up by: Julieanne Manson MD,  Nov 29, 2009 8:33 AM    Additional Follow-up for Phone Call Additional follow up Details #2::    Spoke with pt. - advised of 12/28/09 appt. and that Dr. Delrae Alfred will discuss treatment and side effects then.  Requested copy of labs, mailed as per request.  Follow-up by: Dutch Quint RN,  Nov 29, 2009 12:21 PM

## 2010-08-14 NOTE — Assessment & Plan Note (Signed)
Summary: FU IN 6 WKS DEPRESION/ FATIGUE, PSA ///GK   Vital Signs:  Patient profile:   50 year old male Weight:      184 pounds Temp:     97.9 degrees F Pulse rate:   92 / minute Pulse rhythm:   regular Resp:     18 per minute BP sitting:   122 / 80  (left arm) Cuff size:   large  Vitals Entered By: Vesta Mixer CMA (January 30, 2010 10:19 AM) CC: f/u on fatigue and depression, also PSA Is Patient Diabetic? No Pain Assessment Patient in pain? no       Does patient need assistance? Ambulation Normal   CC:  f/u on fatigue and depression and also PSA.  History of Present Illness: 1.  Fatigue:  Still quite fatigued.  2.  Depression:  Had a job, then was cut down to 12 hours/week and that has worsened his depression.  Weaning off the Wellbutrin did not seem to make a difference.  No suicidal or homicidal ideation.  Really feels when he has a job, he does fine.  Is activiely looking for a new full time job.  Allergies (verified): No Known Drug Allergies  Physical Exam  General:  NAD   Impression & Recommendations:  Problem # 1:  LOW TESTOSTERONE (ICD-257.2) Feel most likely cause for fatigue--possibly even depression Discussed increased risk for prostate Ca, hypercoaguability, increased cholesterol Need to monitor CBC, PSA, cholesterol Orders: T-PSA ([id number])  Problem # 2:  DEPRESSION (ICD-311) Off Wellbutrin. Encouraged finding other things to occupy mind until has a full time job. Sounds like no problems as long as working. The following medications were removed from the medication list:    Wellbutrin Sr 150 Mg Tb12 (Bupropion hcl) .Marland Kitchen... 1 tab by mouth two times a day His updated medication list for this problem includes:    Amitriptyline Hcl 50 Mg Tabs (Amitriptyline hcl) .Marland Kitchen... 1 tab by mouth q hs  Complete Medication List: 1)  Amitriptyline Hcl 50 Mg Tabs (Amitriptyline hcl) .Marland Kitchen.. 1 tab by mouth q hs 2)  Protonix 40 Mg Tbec (Pantoprazole sodium) .Marland Kitchen.. 1tpoqd  for esophageal reflux 3)  Miralax Powd (Polyethylene glycol 3350) .Marland KitchenMarland KitchenMarland Kitchen 17 g in 8 oz fluid daily--may decrease frequency of use and amount used if stools become loose or watery 4)  Nasacort Aq 55 Mcg/act Aers (Triamcinolone acetonide(nasal)) .... 2 sprays each nostril daily 5)  Singulair 10 Mg Tabs (Montelukast sodium) .Marland Kitchen.. 1 tab by mouth daily 6)  Multivitamins Tabs (Multiple vitamin) .Marland Kitchen.. 1 tab by mouth daily 7)  Xyzal 5 Mg Tabs (Levocetirizine dihydrochloride) .Marland Kitchen.. 1 tab by mouth daily 8)  Astelin 137 Mcg/spray Soln (Azelastine hcl) .... 2 sprays each nostril two times a day 9)  Viagra 100 Mg Tabs (Sildenafil citrate) .Marland Kitchen.. 1 tab 1 hour before activity as needed.  do not exceed one tablet in 24 hour period. 10)  Androgel 50 Mg/5gm Gel (Testosterone) .... 5g to shoulders, upper arms or abdomen changed daily  Patient Instructions: 1)  Follow up with Dr. Delrae Alfred in 4 months --for testosterone. 2)  To have testosterone level, FLP, CBC, PSA for lab visit 2 days earlier then appt. with me. Prescriptions: ANDROGEL 50 MG/5GM GEL (TESTOSTERONE) 5g to shoulders, upper arms or abdomen changed daily  #1 month supp x 4   Entered and Authorized by:   Julieanne Manson MD   Signed by:   Julieanne Manson MD on 01/30/2010   Method used:   Print then Give to  Patient   RxID:   [id number]   Appended Document: FU IN 6 WKS DEPRESION/ FATIGUE, PSA ///GK To bring in stool cards in 2 weeks.

## 2010-08-14 NOTE — Progress Notes (Signed)
Summary: NEEDS EAR DROP/DONT HAVE COPAY  Phone Note Call from Patient Call back at Home Phone [phone]   Summary of Call: [facility] PT MR Schifano CALLED AND SAYS THAT HIS ELIGIBILITY HAS EXPIRED AND HIS APPT IS NOT UNTIL 10/03 AND HE DOESN'T HAVE THE $30 COPAY TO PAY. HE IS GOING TO TRY AND CALL IN THE MORNING TO SEE IF SOMEONE HAS CANCELLED Initial call taken by: Leodis Rains,  March 22, 2010 5:11 PM  Follow-up for Phone Call        FWD to provider Michelle Nasuti  March 26, 2010 2:26 PM   Additional Follow-up for Phone Call Additional follow up Details #1::        What are his symptoms?  How do they compare to when he was seen in Urgent Care/ED? Additional Follow-up by: Julieanne Manson MD,  March 29, 2010 10:43 PM    Additional Follow-up for Phone Call Additional follow up Details #2::    symptoms include soreness on inside, pain, drainage and decreased hearing. Symptoms are not nearly as bad as they were, when he went to [facility]. pt request refill on abx PCN/ drops  Follow-up by: Michelle Nasuti,  April 02, 2010 9:41 AM  Additional Follow-up for Phone Call Additional follow up Details #3:: Details for Additional Follow-up Action Taken: Called and spoke with pt.  His initially involved ear is much better (otitis externa treated with Cipro ear drops)  Now having some discomfort and congestion as he states in right ear, but that is actually better now too.  Is coming in Monday, apparently for labwork and will ask Aggie Cosier to take a look at his ear then.  No fever, no current discharge from ear.  In office for evaluation and treatment 04/02/10 PM.  Dutch Quint RN  April 09, 2010 11:28 AM  Additional Follow-up by: Julieanne Manson MD,  April 05, 2010 6:10 PM

## 2010-08-14 NOTE — Assessment & Plan Note (Signed)
Summary: PHYSICAL EXAM//GK   Vital Signs:  Patient profile:   50 year old male Height:      69.75 inches Weight:      176 pounds BMI:     25.53 Temp:     98.1 degrees F Pulse rate:   102 / minute Pulse rhythm:   regular Resp:     18 per minute BP sitting:   135 / 87  (left arm) Cuff size:   regular  Vitals Entered By: Vesta Mixer CMA (Nov 15, 2009 9:49 AM) CC: CPE.   Pt is fasting also Is Patient Diabetic? No Pain Assessment Patient in pain? no       Does patient need assistance? Ambulation Normal   CC:  CPE.   Pt is fasting also.  History of Present Illness: 50 yo male here for CPE.  1.  Depression:  currently taking Wellbutrin two times a day.  Feels he is sluggish with it.  Awakens feeling well rested and then perhaps 40 minutes after taking, starts to feel down--thinking about his problems--lasts 2 hours or so after taking.  Starts later to feel better, but then starts all over with his evening dose.  Has never skipped the medication to see if he felt better.  Later, states that he has held it in afternoon--gets nervous energy when he skips.  Habits & Providers  Alcohol-Tobacco-Diet     Alcohol drinks/day: 32 oz of beer every 3 days     Tobacco Status: current     Cigarette Packs/Day: 0.5     Year Started: age 62  Exercise-Depression-Behavior     Drug Use: past-marijuana  Current Medications (verified): 1)  Amitriptyline Hcl 50 Mg Tabs (Amitriptyline Hcl) .Marland Kitchen.. 1 Tab By Mouth Q Hs 2)  Protonix 40 Mg Tbec (Pantoprazole Sodium) .Marland Kitchen.. 1tpoqd For Esophageal Reflux 3)  Miralax   Powd (Polyethylene Glycol 3350) .Marland KitchenMarland KitchenMarland Kitchen 17 G in 8 Oz Fluid Daily--May Decrease Frequency of Use and Amount Used If Stools Become Loose or Watery 4)  Wellbutrin Sr 150 Mg  Tb12 (Bupropion Hcl) .Marland Kitchen.. 1 Tab By Mouth Two Times A Day 5)  Nasacort Aq 55 Mcg/act Aers (Triamcinolone Acetonide(Nasal)) .... 2 Sprays Each Nostril Daily 6)  Viagra 100 Mg Tabs (Sildenafil Citrate) .Marland Kitchen.. 1 Tab By Mouth 1 Hour  Prior To Activity.  Limit 100 Mg in 24 Hour Period 7)  Singulair 10 Mg Tabs (Montelukast Sodium) .Marland Kitchen.. 1 Tab By Mouth Daily 8)  Multivitamins  Tabs (Multiple Vitamin) .Marland Kitchen.. 1 Tab By Mouth Daily 9)  Xyzal 5 Mg Tabs (Levocetirizine Dihydrochloride) .Marland Kitchen.. 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 1.  2002:   Incidental Appendectomy 2.  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  2002:  Reversal of colostomy 4.   2001:  Sinus surgery 5.  1995:  Surgery in right ear canal--I and D.  Pt. stuck bobby pin in ear secondary to an insect and subsquently infected.  Family History: Reviewed history from 01/21/2008 and no changes required. Mother, died 57:  Cardiac arrest after a stroke,  DM, Hypertension,  CAD Father, died 25:  Strokes, DM, Hypertension, PUD, pacemaker. 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 Eyes:  near sighted--glasses/contacts. Small cataract with last eye exam. ENT:  Sometimes has to have people repeat themselves.. CV:  Denies chest pain or discomfort and palpitations. Resp:  Denies shortness of breath. GI:  Denies abdominal pain, bloody stools, constipation, dark tarry stools, and diarrhea. GU:  Complains of erectile  dysfunction; denies discharge, dysuria, nocturia, and urinary frequency. MS:  Denies joint pain, joint redness, and joint swelling. Derm:  Denies lesion(s) and rash. Neuro:  Somtimes with tingling at end of fingertips--not associated with any action.Marland Kitchen Psych:  Complains of anxiety and depression; denies suicidal thoughts/plans.  Physical Exam  General:  Well-developed,well-nourished,in no acute distress; alert,appropriate and cooperative throughout examination Head:  Normocephalic and atraumatic without obvious abnormalities. No apparent alopecia or balding. Eyes:  No corneal or conjunctival inflammation noted. EOMI. Perrla. Funduscopic exam benign, without hemorrhages, exudates or papilledema. Vision grossly normal. Ears:  External ear exam shows no significant lesions or deformities.  Otoscopic examination reveals clear canals, tympanic membranes are intact bilaterally without bulging, retraction, inflammation or discharge. Hearing is grossly normal bilaterally. Nose:  nasal dischargemucosal pallor.   Mouth:  Oral mucosa and oropharynx without lesions or exudates.  fair dentition.   Neck:  No deformities, masses, or tenderness noted. Lungs:  Normal respiratory effort, chest expands symmetrically. Lungs are clear to auscultation, no crackles or wheezes. Heart:  Normal rate and regular rhythm. S1 and S2 normal without gallop, murmur, click, rub or other extra sounds. Abdomen:  Bowel sounds positive,abdomen soft and non-tender without masses, organomegaly or hernias noted.  Well healed scars on abdomen Rectal:  No external abnormalities noted. Normal sphincter tone. No rectal masses or tenderness.  Heme negative light brown stool Genitalia:  Testes bilaterally descended without nodularity, tenderness or masses. No scrotal masses or lesions. No penis lesions or urethral discharge. Prostate:  Prostate gland firm and smooth, no enlargement, nodularity, tenderness, mass, asymmetry or induration. Msk:   No deformity or scoliosis noted of thoracic or lumbar spine.   Pulses:  R and L carotid,radial,femoral,dorsalis pedis and posterior tibial pulses are full and equal bilaterally Extremities:  No clubbing, cyanosis, edema, or deformity noted with normal full range of motion of all joints.   Neurologic:  No cranial nerve deficits noted. Station and gait are normal. Plantar reflexes are down-going bilaterally. DTRs are symmetrical throughout. Sensory, motor and coordinative functions appear intact. Skin:  Intact without suspicious lesions or rashes Cervical Nodes:  No lymphadenopathy noted Axillary Nodes:  No palpable lymphadenopathy Inguinal Nodes:  No significant adenopathy Psych:  Cognition and judgment appear intact. Alert and cooperative with normal attention span and concentration. No apparent delusions, illusions, hallucinations   Impression & Recommendations:  Problem # 1:  HEALTH MAINTENANCE EXAM (ICD-V70.0) Immunizations up to date. Guaiac Cards x3 to return in 2 weeks Orders: T-Syphilis Test (RPR) [id number]) T-HIV Antibody  (Reflex) [id number]) T-GC Probe, urine ([id number]) T-Chlamydia  Probe, urine ([id number]) UA Dipstick w/o Micro (manual) ([id number])  Problem # 2:  FATIGUE (ICD-780.79)  Orders: T-Testosterone; Total [id number]) T-CBC w/Diff [id number]) T-Comprehensive Metabolic Panel [id number])  Problem # 3:  DEPRESSION (ICD-311) Pt has difficulty explaining how he feels with Wellbutrin--will taper him off and be sure he actually does not feel better on the med. Follow up in 6 weeks to see if will  need to start something else His updated medication list for this problem includes:    Amitriptyline Hcl 50 Mg Tabs (Amitriptyline hcl) .Marland Kitchen... 1 tab by mouth q hs    Wellbutrin Sr 150 Mg Tb12 (Bupropion hcl) .Marland Kitchen... 1 tab by mouth two times a day  Problem # 4:  HYPERLIPIDEMIA, MIXED (ICD-272.2) Okay with last check  Problem # 5:  ELEVATED BLOOD PRESSURE WITHOUT  DIAGNOSIS OF HYPERTENSION (ICD-796.2) Fine today Orders: UA Dipstick w/o Micro (manual) ([id number])  Problem # 6:  ALLERGIC RHINITIS (ICD-477.9) Add Astelin His updated medication list for this problem includes:    Nasacort Aq 55 Mcg/act Aers (Triamcinolone acetonide(nasal)) .Marland Kitchen... 2 sprays each nostril daily    Xyzal 5 Mg Tabs (Levocetirizine dihydrochloride) .Marland Kitchen... 1 tab by mouth daily    Astelin 137 Mcg/spray Soln (Azelastine hcl) .Marland Kitchen... 2 sprays each nostril two times a day  Complete Medication List: 1)  Amitriptyline Hcl 50 Mg Tabs (Amitriptyline hcl) .Marland Kitchen.. 1 tab by mouth q hs 2)  Protonix 40 Mg Tbec (Pantoprazole sodium) .Marland Kitchen.. 1tpoqd for esophageal reflux 3)  Miralax Powd (Polyethylene glycol 3350) .Marland KitchenMarland KitchenMarland Kitchen 17 g in 8 oz fluid daily--may decrease frequency of use and amount used if stools become loose or watery 4)  Wellbutrin Sr 150 Mg Tb12 (Bupropion hcl) .Marland Kitchen.. 1 tab by mouth two times a day 5)  Nasacort Aq 55 Mcg/act Aers (Triamcinolone acetonide(nasal)) .... 2 sprays each nostril daily 6)  Singulair 10 Mg Tabs (Montelukast sodium) .Marland Kitchen.. 1 tab by mouth daily 7)  Multivitamins Tabs (Multiple vitamin) .Marland Kitchen.. 1 tab by mouth daily 8)  Xyzal 5 Mg Tabs (Levocetirizine dihydrochloride) .Marland Kitchen.. 1 tab by mouth daily 9)  Astelin 137 Mcg/spray Soln (Azelastine hcl) .... 2 sprays each nostril two times a day  Patient Instructions: 1)  Stop evening Wellbutrin today.  Stop the morning dose in 1 week. 2)  Follow up with Dr. Delrae Alfred in 6 weeks--depression/fatigue  Preventive Care Screening  Prior Values:    PSA:  0.24 (02/01/2009)    Last Tetanus Booster:  Tdap [facility]) (03/22/2008)     Guaiac Cards:  Did not perform last year. Colonoscopy:  never. STE:  No changes.   Prescriptions: XYZAL 5 MG TABS (LEVOCETIRIZINE DIHYDROCHLORIDE) 1 tab by mouth daily  #30 x 10   Entered and Authorized by:   Julieanne Manson MD   Signed by:   Julieanne Manson MD on 11/15/2009   Method used:   Faxed to ...        [facility] (retail)       [address] [place], [place]  [address]       Ph: [phone]       Fax: [phone]   RxID:   [id number] MULTIVITAMINS  TABS (MULTIPLE VITAMIN) 1 tab by mouth daily  #30 x 11   Entered and Authorized by:   Julieanne Manson MD   Signed by:   Julieanne Manson MD on 11/15/2009   Method used:   Faxed to ...       [facility] - [facility] (retail)       [address] [place], [place]  [address]       Ph: [phone]       Fax: [phone]   RxID:   [id number] SINGULAIR 10 MG TABS (MONTELUKAST SODIUM) 1 tab by mouth daily  #30 x 11   Entered and Authorized by:  Julieanne Manson MD   Signed by:   Julieanne Manson MD on 11/15/2009   Method used:   Faxed to ...       [facility] - [facility] (retail)       [address] [place], [place]  [address]       Ph: [phone]       Fax: [phone]   RxID:   [id number] NASACORT AQ 55 MCG/ACT AERS (TRIAMCINOLONE ACETONIDE(NASAL)) 2 sprays each nostril daily  #1 x 10   Entered and Authorized by:   Julieanne Manson MD   Signed by:   Julieanne Manson MD on 11/15/2009   Method used:   Faxed to ...       [facility] - [facility] (retail)       [address] [place], [place]  [address]       Ph: [phone]       Fax: [phone]   RxID:   [id number] MIRALAX   POWD (POLYETHYLENE GLYCOL 3350) 17 g in 8 oz fluid daily--may decrease frequency of use and amount used if stools become loose or watery  #1 month x 10   Entered and Authorized by:   Julieanne Manson MD   Signed by:   Julieanne Manson MD on 11/15/2009   Method used:   Faxed to ...       [facility] - [facility] (retail)       [address] [place], [place]  [address]       Ph: [phone]       Fax: [phone]   RxID:    [id number] PROTONIX 40 MG TBEC (PANTOPRAZOLE SODIUM) 1tpoqd for esophageal reflux  #30 x 11   Entered and Authorized by:   Julieanne Manson MD   Signed by:   Julieanne Manson MD on 11/15/2009   Method used:   Faxed to ...       [facility] - [facility] (retail)       [address] [place], [place]  [address]       Ph: [phone]       Fax: [phone]   RxID:   [id number] AMITRIPTYLINE HCL 50 MG TABS (AMITRIPTYLINE HCL) 1 tab by mouth q hs  #30 x 11   Entered and Authorized by:   Julieanne Manson MD   Signed by:   Julieanne Manson MD on 11/15/2009   Method used:   Faxed to ...       [facility] - [facility] (retail)       [address] [place], [place]  [address]       Ph: [phone]       Fax: [phone]   RxID:   [id number] ASTELIN 137 MCG/SPRAY SOLN (AZELASTINE HCL) 2 sprays each nostril two times a day  #1 x 11   Entered and Authorized by:   Julieanne Manson MD   Signed by:   Julieanne Manson MD on 11/15/2009   Method used:   Faxed to ...       [facility] - [facility] (retail)       [address] [place], [place]  [address]       Ph: [phone]       Fax: [phone]  RxID:   [id number]   Laboratory Results   Urine Tests    Routine Urinalysis   Glucose: negative   (Normal Range: Negative) Bilirubin: negative   (Normal Range: Negative) Ketone: negative   (Normal Range: Negative) Spec. Gravity: 1.025   (Normal Range: 1.003-1.035) Blood: negative   (Normal Range: Negative) pH: 5.0   (Normal Range: 5.0-8.0) Protein: negative   (Normal Range: Negative) Urobilinogen: 0.2   (Normal Range: 0-1) Nitrite: negative   (Normal Range: Negative) Leukocyte Esterace: trace   (Normal Range: Negative)      Other Tests  Rapid HIV: negative

## 2010-11-30 NOTE — Op Note (Signed)
[facility]  Patient:    GREELY, ATIYEH Visit Number: [id number] MRN: [id number]          Service Type: MED Location: [phone] Attending Physician:  Shelba Flake Dictated by:   Currie Paris, M.D. Proc. Date: 06/20/01 Admit Date:  06/20/2001                             Operative Report  PREOPERATIVE DIAGNOSIS:  Probable appendicitis with perforation.  POSTOPERATIVE DIAGNOSIS:  Perforated sigmoid diverticulitis.  OPERATION PERFORMED:  Diagnostic laparoscopy with laparotomy and sigmoid colon resection, colostomy and Hartmann procedure.  SURGEON:  Currie Paris, M.D.  ANESTHESIA:  General endotracheal.  CLINICAL HISTORY:  This patient presented with abdominal pain which had localized more to the right lower quadrant although he had significant rebound noted and some left lower quadrant tenderness.  DESCRIPTION OF PROCEDURE:  The patient was brought to the operating room and after satisfactory general endotracheal anesthesia had been obtained, the abdomen was prepped and draped. I used a little Marcaine at the umbilical site and made an umbilical incision, transversely opened the fascia vertically in the midline and entered the peritoneal cavity. The Hasson was introduced and held in place with the pursestring. Upon placing the camera and insufflating the abdomen, I noticed there was purulent fluid in the pelvis and overlying the small bowel. The appendix was not immediately visualized. I placed a 5 mm trocar in the right upper quadrant and then used a grasper through that and saw that the appendix was normal. Manipulating some of the small bowel, there seemed to be marked inflammatory component in the left lower quadrant around the sigmoid with some loops of bowel stuck to the sigmoid there. There was a fair amount of purulent material present in the peritoneal cavity, more than I had expected by the minimal amount of  guarding I had noticed preoperatively.  At this point, the abdomen was opened through a lower midline incision going from the umbilicus to the symphysis. The small bowel was gradually manipulated out of the abdomen and purulent material and exudate taken off of that. There was a marked area of the mid distal sigmoid colon that looked like the area of perforation where the colon was markedly edematous and thickened. The colon distal to that was basically rectum and appeared normal and the proximal colon likewise appeared to be normal.  The small bowel was run and appeared to be normal. The stomach was normal and there was no evidence of a duodenal perforation. I irrigated and suctioned purulent material from around the liver and spleen.  The retractors were placed and the left colon mobilized up so that I could free up this area of sigmoid. The left ureter was seen and was well away from the area of dissection. I elected to go ahead and do a resection and this was accomplished using the GIA to divide the colon proximal to the area of inflammatory process. The mesocolon was then divided with clamps serially until I was distal to the area of perforation. This was a relatively short segment. The colon was again divided with the GIA leaving the closed Hartmann.  A check was made for hemostasis and the area irrigated and everything appeared to be dry. Once we had irrigated this last time, this time with a couple of liters of fluid, I went ahead to do the colostomy. I made a button skin excision  in the left lower mid abdomen and opened the fascia with a transverse and then a vertical incision. The peritoneum was entered and the loop of distal sigmoid brought out and came out easily. It was tacked to the opening internally at the peritoneum with several sutures of 3-0 Vicryl.  The abdomen was then closed with a running PDS on the posterior layer and running #1 PDS on the main anterior fascia.  The skin was irrigated and closed with staples with some wicks placed between them.  The closed staple line of the colostomy was cut off and the colostomy matured with 3-0 chromic. The lumen appeared quite patent and the mucosa viable and pink.  The patient tolerated the procedure well. There were no operative complications. All counts were correct. Estimated blood loss was about 100 cc. ictated by:   Currie Paris, M.D. Attending Physician:  Shelba Flake DD:  06/20/01 TD:  06/21/01 Job: [id number] [id number]

## 2010-11-30 NOTE — H&P (Signed)
[facility]  Patient:    Jason Fritz, Jason Fritz Visit Number: [id number] MRN: [id number]          Service Type: MED Location: [phone] Attending Physician:  Shelba Flake Dictated by:   Currie Paris, M.D. Admit Date:  06/20/2001                           History and Physical  VISIT NUMBER:  [id number]  CHIEF COMPLAINT:  Abdominal pain.  CLINICAL HISTORY:  Jason Fritz is a 50 year old gentleman who presented first to [facility], then to the emergency room with onset of abdominal pain yesterday which had begun fairly suddenly but was midabdominal initially and occurred somewhat after he had had a bowel movement.  The pain had become gradually worse and was much worse on the right side than the left, but he did have some persistent left-sided tenderness and pain as well.  He had a little bit of nausea but that had cleared and he was actually hungry when I saw him. He said the pain was worse lying down and considered it quite severe initially, but he had had a pain shot of Toradol prior to my seeing him in the emergency room and that had been given over at the [facility] place.  He had never had any other GI complaints and had always otherwise been in good health.  He had had no reflux-type symptoms, he has never had peptic disease, he has never had blood in his stools.  He has always had fairly normal bowel movements.  PAST MEDICAL HISTORY:  The patient has generally been in good health.  He has had no prior operations.  ALLERGIES:  He has no known allergies.  PAST SURGICAL HISTORY:  He has never been operated on before.  MEDICATIONS:  He is on no medications.  HABITS:  He smokes about a half a pack per day.  He drinks about two beers per day.  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  FAMILY HISTORY:  His father and mother have both had some problems  with hypertension and adult-onset diabetes, both developing it in their 75s.  REVIEW OF SYSTEMS:  HEENT:  Negative.  CHEST:  No cough or shortness of breath.  HEART:  No history of murmurs or hypertension.  ABDOMEN:  Negative except for HPI and the fact that he recently developed a right inguinal hernia, noticed a bulge but that was not currently part of todays symptoms. EXTREMITIES:  Negative.  PHYSICAL EXAMINATION:  GENERAL:  Patient is a thin, alert male who appears uncomfortable but not toxic.  Patient was alert and oriented.  VITAL SIGNS:  Vital signs at the time of admission here showed a blood pressure of 118, pulse of 97, temperature of 98.  He had a temperature of 99.9 earlier when he was at [facility] with a pulse of 108.  HEENT:  Head was normocephalic.  Eyes are nonicteric.  Pupils are round and regular.  NECK:  Supple.  There are no masses or thyromegaly.  LUNGS:  Clear to auscultation.  HEART:  Regular.  No murmurs, rubs, or gallops.  ABDOMEN:  Soft in the upper abdomen, more guarding in the lower abdomen, particularly the right side with marked right lower quadrant tenderness and some left lower quadrant tenderness.  There was marked rebound present, particularly for into the right lower quadrant.  Bowel  sounds were hypoactive.  EXTREMITIES:  No cyanosis or edema.  Pulses were intact.  IMPRESSION:  Acute abdominal pain, probable acute appendicitis with perforation.  PLAN:  Laparoscopy with hopeful laparoscopic appendectomy.  Procedure, risks and complications discussed with the patient including the possibility of alternative findings including diverticulitis, perforated ulcer, etc.  We will plan to proceed to surgery.  Dictated by:   Currie Paris, M.D. Attending Physician:  Shelba Flake DD:  06/20/01 TD:  06/21/01 Job: [id number]

## 2010-11-30 NOTE — Discharge Summary (Signed)
[facility]  Patient:    Jason Fritz, Jason Fritz Visit Number: [id number] MRN: [id number]          Service Type: EMS Location: ED Attending Physician:  Tobey Bride Dictated by:   Currie Paris, M.D. Admit Date:  10/17/2001 Discharge Date: 10/17/2001                             Discharge Summary  VISIT NUMBER:  [id number]  FINAL DIAGNOSES: 1. Diverticulosis status post colostomy Hartmann for acute diverticulitis. 2. Inflamed epidermoid cyst, left axilla.  CLINICAL HISTORY:  Mr. Morelos is a 50 year old gentleman who underwent colostomy with sigmoid resection a few months ago because of acute diverticulitis.  He was admitted electively for takedown of his colostomy.  In addition, he developed just prior to admission an inflamed epidermoid cyst of the left axilla which he desired for [place] to proceed with excision.  HOSPITAL COURSE:  The patient was seen in the holding area, taken to the operating room, and takedown of his colostomy with partial colectomy.  Primary anastomosis was done as well as excision of epidermoid cyst.  He tolerated the procedure well.  Postoperatively, he initially had a fair amount of pain, but once that was controlled with the PCA, he did very well.  He was started on liquids on the first postoperative day, advanced to solid foods on the second postoperative day, and was ready for discharge on the third postoperative day feeling fine, comfortable.  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  DISCHARGE MEDICATIONS:  The patient was discharged on his usual medications, Tylenol, Advil, or Vicodin as needed for pain.  WOUND CARE:  Able to shower and to call our office for any problems.  FOLLOWUP:  Followup visit in five to six days.  LABORATORY DATA: Preoperative hemoglobin 15 and postop 13.  Electrolytes were normal preoperatively.  Urinalysis was basically negative.  EKG was normal with no significant changes from June 20, 2002.  Preoperative chest x-ray showed some mild COPD. Dictated by:   Currie Paris, M.D. Attending Physician:  Tobey Bride DD:  10/26/01 TD:  10/26/01 Job: [id number]

## 2010-11-30 NOTE — Op Note (Signed)
[facility]  Patient:    LIN, GLAZIER Visit Number: [id number] MRN: [id number]          Service Type: Attending:  Currie Paris, M.D. Dictated by:   Currie Paris, M.D. Proc. Date: 10/14/01                             Operative Report  VISIT NUMBER:  [id number]  PREOPERATIVE DIAGNOSES: 1. Diverticulosis, status post colostomy and Hartmann resection for    diverticulitis. 2. Epidermoid cyst, left axilla, chronically inflamed with recent spontaneous    drainage.  POSTOPERATIVE DIAGNOSES: 1. Diverticulosis, status post colostomy and Hartmann resection for    diverticulitis. 2. Epidermoid cyst, left axilla, chronically inflamed with recent spontaneous    drainage.  OPERATION:  Take-down of colostomy with take-down of splenic flexure and resection of descending colon and reanastomosis; excision of epidermoid cyst, left axilla.  SURGEON:  Currie Paris, M.D.  ASSISTANT:  Gita Kudo, M.D.  ANESTHESIA:  General endotracheal.  CLINICAL HISTORY:  This patient is a 50 year old, status post Hartmann for perforated diverticulitis.  He has done well.  Recent barium enema showed __________ of the left candidate, and he is a candidate to be resected.  He had pointed out a chronic epidermoid cyst in the left axilla which in the interval between the time I saw him in the office and now had become inflamed and drained, but it has now settled down, and we elected to go ahead and excise this while he was under anesthesia at his request.  Because of the inflammation, I thought this was an indicated procedure.  DESCRIPTION OF PROCEDURE:  The patient was taken to the operating room and after satisfactory general endotracheal anesthesia had been obtained, the axillary area was shaved and prepped.  While the epidermoid cyst was excised in toto with surrounding skin and got well beyond it and the subcutaneous fatty tissue.  This was closed  with some 3-0 nylon.  The abdomen had been prepped out and catheter placed while that was being accomplished.  Prior to starting, I had closed the colostomy with a pursestring of 3-0 chromic.  The old scar was excised which had extended up as high as the umbilicus and the peritoneal cavity entered.  Some omental adhesions were taken down.  The small bowel was mobilized out, and multiple small bowel adhesions to each other, to the pelvis, and to the colostomy, and to the rectal stump were taken down and freed up.  We had an adequately long rectal stump.  Self-retainers were placed and the rectal stump freed up using cautery, staying right on it.  It did not extend up into the area in the ureter, so we did not dissect over in that area as we were inferior to that. I cleaned off a spot about 4 cm distal to the closed end of the stump so that I could do an anastomosis there as we had healthy bowel and were clearly in the rectum where the ___________.  The packs and self-retainer were removed, and some omental adhesions around the colostomy taken down and the colostomy freed up intraperitoneally.  I then made an elliptical incision around the colostomy on the skin, freeing up the subcutaneous tissues, and was able to pull it back through into the peritoneal cavity.  I went ahead at this point and closed the fascia at this incision with interrupted 0 Novofils.  Unfortunately, there were  a couple of fairly large ticks in the proximal descending colon, and to get proximal to those, I needed to take down the splenic flexure so that I would have no tension on the final anastomosis.  The incision therefore had to be extended superiorly which was done.  The splenic flexure was taken down with cautery and clamps, and freed up all the way around to the mid transverse colon with the omentum being taken off of part of this so that we would have plenty length.  The mesocolon was divided up to an area of  the colon just proximal to the most proximal of the ticks in the colon divided here between an Freida Busman and a Kocher clamp.  This all appeared to be viable.  We had good blood supply and this easily went into the pelvis for the anastomosis with absolutely no tension.  A Satinsky clamp was placed on the rectum and the rectal stump cut off, and thus, giving [place] access to the colon.  A spring clamp was placed on the proximal bowel and the Allen clamp taken off, opening that.  We had good prep and there is no spillage of stool.  An end-to-end hand sewn anastomosis was done using 3-0 silks.  A back layer was placed and then an inverting anterior layer placed with a couple of ______ sutures to close the very middle of the suture line.  In probing the suture line, I could not find any open areas after we had got all this closed. We filled the pelvis with some water, and milked some air through the anastomotic site and there were no air bubbles suggesting any evidence of leak, and et al appeared to be viable.  Gloves and instruments were changed.  The mesocolon was packed down so that there would be no spot for internal hernia, going all the way up to the splenic flexure.  A few bits of omentum had to be trimmed off where they had become nonviable as we dissected up these omental adhesions off the anterior abdominal wall.  The abdomen was irrigated with 2 L of saline and everything appeared to be dry.  There was no active bleeding, and the small bowel had been returned in its normal anatomic position.  The abdomen was closed with a running #1 PDS on the fascia.  The wound was irrigated and closed with staples.  The patient tolerated the procedure well.  There were no operative complications.  All counts were correct. Dictated by:   Currie Paris, M.D. Attending:  Currie Paris, M.D. DD:  10/14/01 TD:  10/15/01 Job: [id number]

## 2010-11-30 NOTE — Discharge Summary (Signed)
[facility]  Patient:    Jason Fritz, Jason Fritz Visit Number: [id number] MRN: [id number]          Service Type: SUR Location: [id number] [phone] Attending Physician:  Charlton Haws Dictated by:   Currie Paris, M.D. Admit Date:  06/20/2001 Discharge Date: 06/27/2001                             Discharge Summary  VISIT NUMBER:  [id number]  FINAL DIAGNOSIS:  Diverticulosis and diverticulitis.  CLINICAL HISTORY:  Jason Fritz is a 50 year old who presented to the emergency room on December 7 with what initially was thought possibly to be a perforated appendix with abdominal pain which was more right lower quadrant than left lower quadrant although it had a somewhat abrupt onset.  After discussion with the patient, he was taken to the operating room for a laparoscopy.  HOSPITAL COURSE:  Patient was admitted and taken to the operating room for a laparoscopy and possible laparoscopic appendectomy.  At the time, he had grossly purulent material in the peritoneal cavity and his appendix were normal.  He underwent laparotomy and the only abnormality was a markedly thickened area of sigmoid colon and I suspect that he had a microperforation. Because of the diffuse peritonitis, I went ahead and did a sigmoid resection and end colostomy with Jason Fritz with plans to return him to the operating room in several months to reestablish intestinal continuity.  The patient felt dramatically better following surgery.  He had incisional pain, but the preoperative diffuse abdominal pain was markedly improved.  His white count came down.  We did not need to use an NG tube and managed his pain medications with PCA.  By the third postoperative day, he was feeling a little bit better.  We started him on some sips of liquids, gradually increased his diet, and he continued to progress.  His ostomy began to work.  HIs wound was healing nicely and his diet was gradually  increased.  By December 14, he was felt able to be discharged.  He had been taught colostomy management and he will be followed up in the office to get his staples out.  He is given a prescription for Tylox for pain.  Home health care was managed to help initial management of his ostomy at home.  LABORATORY STUDIES:  Hemoglobin 13.3 on admission and 12.3 at discharge. Initial white count was 13,000 and drifted to 7100 by discharge.  His urine was markedly concentrated at admission with a specific gravity of 1.040 consistent with his volume depletion secondary to his peritonitis.  Cultures of the peritoneal cavity showed some E. coli sensitive across the board.  He also had acute bacteroides.  EKG was normal.  Pathology report did not show a definite perforation and there was some question that this was a periappendicitis, but the gross component at the time of surgery was thought to be localized consistent mostly with a localized peritonitis. Dictated by:   Currie Paris, M.D. Attending Physician:  Charlton Haws DD:  07/06/01 TD:  07/07/01 Job: [id number]

## 2013-04-29 ENCOUNTER — Encounter: Payer: Self-pay | Admitting: Internal Medicine

## 2013-04-29 ENCOUNTER — Ambulatory Visit: Payer: No Typology Code available for payment source | Attending: Internal Medicine | Admitting: Internal Medicine

## 2013-04-29 VITALS — BP 133/95 | HR 84 | Temp 97.8°F | Resp 16 | Wt 192.4 lb

## 2013-04-29 DIAGNOSIS — Z23 Encounter for immunization: Secondary | ICD-10-CM

## 2013-04-29 DIAGNOSIS — K047 Periapical abscess without sinus: Secondary | ICD-10-CM

## 2013-04-29 DIAGNOSIS — E782 Mixed hyperlipidemia: Secondary | ICD-10-CM

## 2013-04-29 DIAGNOSIS — F17200 Nicotine dependence, unspecified, uncomplicated: Secondary | ICD-10-CM

## 2013-04-29 DIAGNOSIS — K089 Disorder of teeth and supporting structures, unspecified: Secondary | ICD-10-CM

## 2013-04-29 DIAGNOSIS — K5900 Constipation, unspecified: Secondary | ICD-10-CM

## 2013-04-29 DIAGNOSIS — K219 Gastro-esophageal reflux disease without esophagitis: Secondary | ICD-10-CM

## 2013-04-29 MED ORDER — SENNOSIDES-DOCUSATE SODIUM 8.6-50 MG PO TABS: 1.0000 | ORAL_TABLET | Freq: Every day | ORAL | Status: DC

## 2013-04-29 MED ORDER — OMEPRAZOLE 20 MG PO CPDR: 20.0000 mg | DELAYED_RELEASE_CAPSULE | Freq: Every day | ORAL | Status: DC

## 2013-04-29 MED ORDER — AMOXICILLIN-POT CLAVULANATE 875-125 MG PO TABS: 1.0000 | ORAL_TABLET | Freq: Two times a day (BID) | ORAL | Status: DC

## 2013-04-29 MED ORDER — IBUPROFEN 600 MG PO TABS: 600.0000 mg | ORAL_TABLET | Freq: Three times a day (TID) | ORAL | Status: DC | PRN

## 2013-04-29 NOTE — Progress Notes (Signed)
Patient ID: Jason Fritz, male   DOB: 04/15/1961, 52 y.o.   MRN: [id number] Patient Demographics  Jason Fritz, is a 52 y.o. male  [id number]:[id number]  [id number]  DOB - January 25, 1961  Chief Complaint  Patient presents with  . dental referral        Subjective:   Jason Fritz today is here to establish primary care.  Patient is a 52 year old male with history of diverticulitis, nicotine abuse, hyperlipidemia, GERD presented to establish care and needs a dental referral. Patient reports that his last left upper molar, has been hurting significantly and difficult to eat and has some facial swelling. No fevers or chills..  Patient has No headache, No chest pain, No abdominal pain - No Nausea, No new weakness tingling or numbness, No Cough - SOB.   Objective:    Filed Vitals:   04/29/13 1423  BP: 133/95  Pulse: 84  Temp: 97.8 F (36.6 C)  Resp: 16  Weight: 192 lb 6.4 oz (87.272 kg)  SpO2: 100%     ALLERGIES:  No Known Allergies  PAST MEDICAL HISTORY: Past Medical History  Diagnosis Date  . Hypertension   . GERD (gastroesophageal reflux disease)     PAST SURGICAL HISTORY: Past Surgical History  Procedure Laterality Date  . Colon surgery      had complicated diverticulitis 2005    FAMILY HISTORY: Family History  Problem Relation Age of Onset  . Stroke Mother   . Hypertension Mother   . Stroke Father   . Diabetes Father     MEDICATIONS AT HOME: Prior to Admission medications   Medication Sig Start Date End Date Taking? Authorizing Provider  amoxicillin-clavulanate (AUGMENTIN) 875-125 MG per tablet Take 1 tablet by mouth 2 (two) times daily. 04/29/13   Lameeka Schleifer Jenna Luo, MD  ibuprofen (ADVIL,MOTRIN) 600 MG tablet Take 1 tablet (600 mg total) by mouth every 8 (eight) hours as needed for pain. 04/29/13   Kaijah Abts Jenna Luo, MD  omeprazole (PRILOSEC) 20 MG capsule Take 1 capsule (20 mg total) by mouth daily. 04/29/13   Rena Hunke Jenna Luo, MD  senna-docusate (SENOKOT-S)  8.6-50 MG per tablet Take 1 tablet by mouth at bedtime. For constipation 04/29/13   Ianna Salmela Jenna Luo, MD    REVIEW OF SYSTEMS:  Constitutional:   No   Fevers, chills, fatigue.  HEENT:     see history of present illness  Cardio-vascular: No chest pain,  Orthopnea, swelling in lower extremities, anasarca, palpitations  GI:  No abdominal pain, nausea, vomiting, diarrhea  Resp: No shortness of breath,  No coughing up of blood.No cough.No wheezing.  Skin:  no rash or lesions.  GU:  no dysuria, change in color of urine, no urgency or frequency.  No flank pain.  Musculoskeletal: No joint pain or swelling.  No decreased range of motion.  No back pain.  Psych: No change in mood or affect. No depression or anxiety.  No memory loss.   Exam  General appearance :Awake, alert, NAD, Speech Clear. HEENT: Atraumatic and Normocephalic, PERLA. Poor dental hygiene with upper left third molar jagged, grey with facial swelling  Neck: supple, no JVD. No cervical lymphadenopathy.  Chest: clear to auscultation bilaterally, no wheezing, rales or rhonchi CVS: S1 S2 regular, no murmurs.  Abdomen: soft, NBS, NT, ND, no gaurding, rigidity or rebound. Extremities: No cyanosis, clubbing, B/L Lower Ext shows no edema,  Neurology: Awake alert, and oriented X 3, CN II-XII intact, Non focal Skin:No Rash or lesions Wounds: N/A  Data Review   Basic Metabolic Panel: No results found for this basename: NA, K, CL, CO2, GLUCOSE, BUN, CREATININE, CALCIUM, MG, PHOS,  in the last 168 hours Liver Function Tests: No results found for this basename: AST, ALT, ALKPHOS, BILITOT, PROT, ALBUMIN,  in the last 168 hours  CBC: No results found for this basename: WBC, NEUTROABS, HGB, HCT, MCV, PLT,  in the last 168 hours ------------------------------------------------------------------------------------------------------------------ No results found for this basename: HGBA1C,  in the last 72  hours ------------------------------------------------------------------------------------------------------------------ No results found for this basename: CHOL, HDL, LDLCALC, TRIG, CHOLHDL, LDLDIRECT,  in the last 72 hours ------------------------------------------------------------------------------------------------------------------ No results found for this basename: TSH, T4TOTAL, FREET3, T3FREE, THYROIDAB,  in the last 72 hours ------------------------------------------------------------------------------------------------------------------ No results found for this basename: VITAMINB12, FOLATE, FERRITIN, TIBC, IRON, RETICCTPCT,  in the last 72 hours  Coagulation profile  No results found for this basename: INR, PROTIME,  in the last 168 hours    Assessment & Plan   Active Problems:   Dental abscess - Placed on Augmentin BID x 2weeks, Ibuprofen, urgent referral to dentistry  Constipation with history of diverticulitis - Placed on Senokot-S qhs,  - GI referral for screening colonoscopy  GERD: Continue PPI  Health screening - Ordered future CBC, CMET, lipid panel - Flu shot today - GI referral for a screening colonoscopy - Patient strongly counseled on smoking cessation  Recommendations:Patient recommended to have labs done before the visit.   Follow-up in 2 months   Dayyan Krist M.D. 04/29/2013, 2:35 PM

## 2013-04-29 NOTE — Progress Notes (Signed)
Patient is here for a dental referral Has an abscess to the top right tooth Would also like a flu shot

## 2013-04-30 ENCOUNTER — Telehealth: Payer: Self-pay | Admitting: Emergency Medicine

## 2013-04-30 NOTE — Telephone Encounter (Signed)
Pt called requesting dental/GI referral Informed pt once referral sent out by Arna Medici, facility will call with appt.

## 2016-04-29 ENCOUNTER — Encounter (HOSPITAL_COMMUNITY): Payer: Self-pay | Admitting: Emergency Medicine

## 2016-04-29 ENCOUNTER — Emergency Department (HOSPITAL_COMMUNITY)
Admission: EM | Admit: 2016-04-29 | Discharge: 2016-04-29 | Disposition: A | Discharge status: Home / Self Care | Payer: 59 | Attending: Emergency Medicine | Admitting: Emergency Medicine

## 2016-04-29 ENCOUNTER — Emergency Department (HOSPITAL_COMMUNITY): Payer: 59

## 2016-04-29 DIAGNOSIS — I10 Essential (primary) hypertension: Secondary | ICD-10-CM | POA: Diagnosis not present

## 2016-04-29 DIAGNOSIS — R05 Cough: Secondary | ICD-10-CM | POA: Diagnosis present

## 2016-04-29 DIAGNOSIS — F17200 Nicotine dependence, unspecified, uncomplicated: Secondary | ICD-10-CM | POA: Diagnosis not present

## 2016-04-29 DIAGNOSIS — J069 Acute upper respiratory infection, unspecified: Secondary | ICD-10-CM | POA: Insufficient documentation

## 2016-04-29 MED ORDER — AMOXICILLIN 500 MG PO CAPS: 500.0000 mg | ORAL_CAPSULE | Freq: Three times a day (TID) | ORAL | 0 refills | Status: DC

## 2016-04-29 NOTE — ED Notes (Signed)
Pt is in stable condition upon d/c and ambulates from ED. 

## 2016-04-29 NOTE — ED Triage Notes (Signed)
Pt sts productive cough x several days

## 2016-04-29 NOTE — ED Notes (Signed)
PA at bedside.

## 2016-04-29 NOTE — ED Provider Notes (Signed)
[facility] DEPT Provider Note   CSN: [id number] Arrival date & time: 04/29/16  0900     History   Chief Complaint Chief Complaint  Patient presents with  . Cough    HPI  Blood pressure 134/98, pulse 90, temperature 97.7 F (36.5 C), temperature source Oral, resp. rate 18, height 5\' 9"  (1.753 m), weight 83.9 kg, SpO2 98 %.  Jason Fritz is a 55 y.o. male complaining of rhinorrhea, productive cough worsening at night, mild pleuritic chest pain with tactile fever, chills. Patient denies shortness of breath, nausea, vomiting, perheral edema, othopnea, PND. positive sick contacts in that his son also has a upper respiratory infection  HPI  Past Medical History:  Diagnosis Date  . GERD (gastroesophageal reflux disease)   . Hypertension     Patient Active Problem List   Diagnosis Date Noted  . Unspecified constipation 04/29/2013  . Dental abscess 04/29/2013  . OTITIS MEDIA, ACUTE, RIGHT 04/02/2010  . FATIGUE 11/15/2009  . EAR PAIN, RIGHT 11/16/2008  . DENTAL DISORDER 03/22/2008  . ERECTILE DYSFUNCTION 01/21/2008  . CONSTIPATION, CHRONIC 01/21/2008  . 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 ELEVATED BLOOD PRESSURE WITHOUT DIAGNOSIS OF HYPERTENSION 12/04/2007  . TOBACCO USER 11/22/2006  . DEPRESSION 11/22/2006  . ALLERGIC RHINITIS 11/22/2006  . GERD 11/22/2006  . DIVERTICULITIS, HX OF 11/22/2006    Past Surgical History:  Procedure Laterality Date  . COLON SURGERY     had complicated diverticulitis 2005       Home Medications    Prior to Admission medications   Medication Sig Start Date End Date Taking? Authorizing Provider  amoxicillin (AMOXIL) 500 MG capsule Take 1 capsule (500 mg total) by mouth 3 (three) times daily. 04/29/16   Abhi Moccia, PA-C  amoxicillin-clavulanate (AUGMENTIN) 875-125 MG per tablet Take 1 tablet by mouth 2 (two) times daily. 04/29/13   Ripudeep Krystal Eaton, MD  ibuprofen (ADVIL,MOTRIN) 600 MG tablet Take 1 tablet (600 mg total) by  mouth every 8 (eight) hours as needed for pain. 04/29/13   Ripudeep Krystal Eaton, MD  omeprazole (PRILOSEC) 20 MG capsule Take 1 capsule (20 mg total) by mouth daily. 04/29/13   Ripudeep Krystal Eaton, MD  senna-docusate (SENOKOT-S) 8.6-50 MG per tablet Take 1 tablet by mouth at bedtime. For constipation 04/29/13   Ripudeep Krystal Eaton, MD    Family History Family History  Problem Relation Age of Onset  . Stroke Mother   . Hypertension Mother   . Stroke Father   . Diabetes Father     Social History Social History  Substance Use Topics  . Smoking status: Current Every Day Smoker  . Smokeless tobacco: Not on file     Comment: 3 cigs/day  . Alcohol use Yes     Comment: occasionally     Allergies   Review of patient's allergies indicates no known allergies.   Review of Systems Review of Systems  10 systems reviewed and found to be negative, except as noted in the HPI.   Physical Exam Updated Vital Signs BP 134/98 (BP Location: Right Arm)   Pulse 90   Temp 97.7 F (36.5 C) (Oral)   Resp 18   Ht 5\' 9"  (1.753 m)   Wt 83.9 kg   SpO2 98%   BMI 27.32 kg/m   Physical Exam  Constitutional: He is oriented to person, place, and time. He appears well-developed and well-nourished. No distress.  HENT:  Head: Normocephalic and atraumatic.  Right Ear: External ear normal.  Left Ear: External  ear normal.  Mouth/Throat: Oropharynx is clear and moist. No oropharyngeal exudate.  No drooling or stridor. Posterior pharynx mildly erythematous no significant tonsillar hypertrophy. No exudate. Soft palate rises symmetrically. No TTP or induration under tongue.   No tenderness to palpation of frontal or bilateral maxillary sinuses.  Mild mucosal edema in the nares with ++ profuse rhinorrhea.  Bilateral tympanic membranes with normal architecture and good light reflex.    Eyes: Conjunctivae and EOM are normal. Pupils are equal, round, and reactive to light.  Neck: Normal range of motion. Neck supple.  No JVD present. No tracheal deviation present.  Cardiovascular: Normal rate, regular rhythm and intact distal pulses.   Pulmonary/Chest: Effort normal and breath sounds normal. No stridor. No respiratory distress. He has no wheezes. He has no rales. He exhibits no tenderness.  Transmitted upper airway sounds  Abdominal: Soft. He exhibits no distension and no mass. There is no tenderness. There is no rebound and no guarding.  Musculoskeletal: Normal range of motion. He exhibits no edema or tenderness.  No calf asymmetry, superficial collaterals, palpable cords, edema, Homans sign negative bilaterally.    Neurological: He is alert and oriented to person, place, and time.  Skin: Skin is warm. He is not diaphoretic.  Psychiatric: He has a normal mood and affect.  Nursing note and vitals reviewed.    ED Treatments / Results  Labs (all labs ordered are listed, but only abnormal results are displayed) Labs Reviewed - No data to display  EKG  EKG Interpretation None       Radiology Dg Chest 2 View  Result Date: 04/29/2016 CLINICAL DATA:  Productive cough for several days. EXAM: CHEST  2 VIEW COMPARISON:  None. FINDINGS: The heart size and mediastinal contours are within normal limits. Both lungs are clear. No pneumothorax or pleural effusion is noted. The visualized skeletal structures are unremarkable. IMPRESSION: No active cardiopulmonary disease. Electronically Signed   By: Marijo Conception, M.D.   On: 04/29/2016 09:51    Procedures Procedures (including critical care time)  Medications Ordered in ED Medications - No data to display   Initial Impression / Assessment and Plan / ED Course  I have reviewed the triage vital signs and the nursing notes.  Pertinent labs & imaging results that were available during my care of the patient were reviewed by me and considered in my medical decision making (see chart for details).  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 Patient is adamant that he would like antibiotics, the explained to him that antibiotics do not help viral infections and may predispose him for issues with antibiotic resistance when he does need antibiotics, patient again is adamant that he would like antibiotics and feels that it is the only thing they can help him, I've advised him that this will not be helpful to him and may also be harmful, patient is adamant that he would like antibiotics. Will write a prescription.  Advised him to follow closely with primary care.  Evaluation does not show pathology that would require ongoing emergent intervention or inpatient treatment. Pt is hemodynamically stable and mentating appropriately. Discussed findings and plan with patient/guardian, who agrees with care plan. All questions answered. Return precautions discussed and outpatient follow up given.       Final Clinical Impressions(s) / ED Diagnoses   Final diagnoses:  Upper respiratory tract infection, unspecified type    New Prescriptions New Prescriptions   AMOXICILLIN (AMOXIL) 500 MG CAPSULE    Take 1 capsule (500 mg total) by mouth 3 (three) times daily.     Monico Blitz, PA-C 04/29/16 1326    Davonna Belling, MD 04/29/16 [phone]

## 2016-04-29 NOTE — Discharge Instructions (Signed)
You can use Flonase nasal spray which is avilable over the counter. Use nasal saline (you can try [employer] Simply Saline) at least 4 times a day, use saline 5-10 minutes before using the fluticasone (flonase) nasal spray  Do not use Afrin (Oxymetazoline)  Rest, wash hands frequently  and drink plenty of water.  You may try over the counter medication such as allegra-decongestant or claritin-decongestant and/or Mucinex or decongestants.  Push fluids to try to thin the mucus and get it to flow out the nose.   Do not hesitate to return to the emergency room for any new, worsening or concerning symptoms.  Please obtain primary care using resource guide below. Let them know that you were seen in the emergency room and that they will need to obtain records for further outpatient management.

## 2017-05-08 ENCOUNTER — Ambulatory Visit (HOSPITAL_COMMUNITY)
Admission: EM | Admit: 2017-05-08 | Discharge: 2017-05-08 | Disposition: A | Discharge status: Home / Self Care | Payer: 59 | Attending: Family Medicine | Admitting: Family Medicine

## 2017-05-08 ENCOUNTER — Encounter (HOSPITAL_COMMUNITY): Payer: Self-pay | Admitting: Emergency Medicine

## 2017-05-08 DIAGNOSIS — K0889 Other specified disorders of teeth and supporting structures: Secondary | ICD-10-CM | POA: Diagnosis not present

## 2017-05-08 MED ORDER — IBUPROFEN 800 MG PO TABS: 800.0000 mg | ORAL_TABLET | Freq: Three times a day (TID) | ORAL | 0 refills | Status: DC

## 2017-05-08 MED ORDER — AMOXICILLIN-POT CLAVULANATE 875-125 MG PO TABS: 1.0000 | ORAL_TABLET | Freq: Two times a day (BID) | ORAL | 0 refills | Status: AC

## 2017-05-08 NOTE — ED Triage Notes (Signed)
Pt here for right upper dental pain x 1 week

## 2017-05-08 NOTE — ED Provider Notes (Addendum)
[name]    CSN: [id number] Arrival date & time: 05/08/17  1622     History   Chief Complaint Chief Complaint  Patient presents with  . Dental Pain    HPI Jason Fritz is a 56 y.o. male.   Jason Fritz presents with complaints of right upper tooth pain and swelling which started approximately 1-2 weeks ago and has been worsening. No drainage. He states he has had work on this tooth in the past, hasn't followed with a dentist in a long period of time. Without fevers or chills. Rates pain 8/10. Has not taken any medication for his symptoms today, took aspirin yesterday only. Denies gi/gu complaints.   ROS per HPI.       Past Medical History:  Diagnosis Date  . GERD (gastroesophageal reflux disease)   . Hypertension     Patient Active Problem List   Diagnosis Date Noted  . Unspecified constipation 04/29/2013  . Dental abscess 04/29/2013  . OTITIS MEDIA, ACUTE, RIGHT 04/02/2010  . FATIGUE 11/15/2009  . EAR PAIN, RIGHT 11/16/2008  . DENTAL DISORDER 03/22/2008  . ERECTILE DYSFUNCTION 01/21/2008  . CONSTIPATION, CHRONIC 01/21/2008  . 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 ELEVATED BLOOD PRESSURE WITHOUT DIAGNOSIS OF HYPERTENSION 12/04/2007  . TOBACCO USER 11/22/2006  . DEPRESSION 11/22/2006  . ALLERGIC RHINITIS 11/22/2006  . GERD 11/22/2006  . DIVERTICULITIS, HX OF 11/22/2006    Past Surgical History:  Procedure Laterality Date  . COLON SURGERY     had complicated diverticulitis 2005       Home Medications    Prior to Admission medications   Medication Sig Start Date End Date Taking? Authorizing Provider  amoxicillin (AMOXIL) 500 MG capsule Take 1 capsule (500 mg total) by mouth 3 (three) times daily. 04/29/16   Pisciotta, Elmyra Ricks, PA-C  amoxicillin-clavulanate (AUGMENTIN) 875-125 MG tablet Take 1 tablet by mouth every 12 (twelve) hours. 05/08/17 05/18/17  Zigmund Gottron, NP  ibuprofen (ADVIL,MOTRIN) 800 MG tablet Take 1 tablet (800 mg total) by  mouth 3 (three) times daily. 05/08/17   Zigmund Gottron, NP  omeprazole (PRILOSEC) 20 MG capsule Take 1 capsule (20 mg total) by mouth daily. 04/29/13   Rai, Ripudeep K, MD  senna-docusate (SENOKOT-S) 8.6-50 MG per tablet Take 1 tablet by mouth at bedtime. For constipation 04/29/13   Rai, Vernelle Emerald, MD    Family History Family History  Problem Relation Age of Onset  . Stroke Mother   . Hypertension Mother   . Stroke Father   . Diabetes Father     Social History Social History  Substance Use Topics  . Smoking status: Current Every Day Smoker  . Smokeless tobacco: Not on file     Comment: 3 cigs/day  . Alcohol use Yes     Comment: occasionally     Allergies   Patient has no known allergies.   Review of Systems Review of Systems   Physical Exam Triage Vital Signs ED Triage Vitals [05/08/17 1700]  Enc Vitals Group     BP 124/90     Pulse Rate 99     Resp 18     Temp 97.7 F (36.5 C)     Temp Source Oral     SpO2 97 %     Weight      Height      Head Circumference      Peak Flow      Pain Score 8     Pain Loc  Pain Edu?      Excl. in [place]?    No data found.   Updated Vital Signs BP 124/90 (BP Location: Right Arm)   Pulse 99   Temp 97.7 F (36.5 C) (Oral)   Resp 18   SpO2 97%   Visual Acuity Right Eye Distance:   Left Eye Distance:   Bilateral Distance:    Right Eye Near:   Left Eye Near:    Bilateral Near:     Physical Exam  Constitutional: He is oriented to person, place, and time. He appears well-developed and well-nourished.  HENT:  Right Ear: Tympanic membrane, external ear and ear canal normal.  Left Ear: Tympanic membrane, external ear and ear canal normal.  Mouth/Throat: Abnormal dentition.    Eyes: Pupils are equal, round, and reactive to light.  Neck: Normal range of motion.  Cardiovascular: Normal rate and regular rhythm.   Pulmonary/Chest: Effort normal and breath sounds normal.  Lymphadenopathy:    He has no cervical  adenopathy.  Neurological: He is alert and oriented to person, place, and time.  Skin: Skin is warm and dry.     UC Treatments / Results  Labs (all labs ordered are listed, but only abnormal results are displayed) Labs Reviewed - No data to display  EKG  EKG Interpretation None       Radiology No results found.  Procedures Procedures (including critical care time)  Medications Ordered in UC Medications - No data to display   Initial Impression / Assessment and Plan / UC Course  I have reviewed the triage vital signs and the nursing notes.  Pertinent labs & imaging results that were available during my care of the patient were reviewed by me and considered in my medical decision making (see chart for details).     Antibiotics initiated at this time for concern for dental abscess. Recommended to call tomorrow to set up with a dentist for definitive treatment. Salt water gargles. Ibuprofen, ice pack application. Patient verbalized understanding and agreeable to plan.    Zigmund Gottron, NP 05/08/2017 5:41 PM   Final Clinical Impressions(s) / UC Diagnoses   Final diagnoses:  Pain, dental    New Prescriptions New Prescriptions   AMOXICILLIN-CLAVULANATE (AUGMENTIN) 875-125 MG TABLET    Take 1 tablet by mouth every 12 (twelve) hours.   IBUPROFEN (ADVIL,MOTRIN) 800 MG TABLET    Take 1 tablet (800 mg total) by mouth 3 (three) times daily.     Controlled Substance Prescriptions [place] Controlled Substance Registry consulted? Not Applicable   Zigmund Gottron, NP 05/08/17 1742    Zigmund Gottron, NP 05/08/17 1743

## 2018-03-25 ENCOUNTER — Ambulatory Visit (HOSPITAL_COMMUNITY)
Admission: RE | Admit: 2018-03-25 | Discharge: 2018-03-25 | Disposition: A | Discharge status: Home / Self Care | Payer: Self-pay | Source: Ambulatory Visit | Attending: Family Medicine | Admitting: Family Medicine

## 2018-03-25 ENCOUNTER — Ambulatory Visit: Payer: Self-pay | Attending: Family Medicine | Admitting: Family Medicine

## 2018-03-25 ENCOUNTER — Telehealth: Payer: Self-pay

## 2018-03-25 ENCOUNTER — Other Ambulatory Visit: Payer: Self-pay

## 2018-03-25 ENCOUNTER — Encounter: Payer: Self-pay | Admitting: Family Medicine

## 2018-03-25 VITALS — BP 126/88 | HR 92 | Temp 98.5°F | Resp 18 | Ht 67.0 in | Wt 225.4 lb

## 2018-03-25 DIAGNOSIS — K219 Gastro-esophageal reflux disease without esophagitis: Secondary | ICD-10-CM

## 2018-03-25 DIAGNOSIS — R0600 Dyspnea, unspecified: Secondary | ICD-10-CM

## 2018-03-25 DIAGNOSIS — K921 Melena: Secondary | ICD-10-CM | POA: Insufficient documentation

## 2018-03-25 DIAGNOSIS — R350 Frequency of micturition: Secondary | ICD-10-CM

## 2018-03-25 DIAGNOSIS — J0140 Acute pansinusitis, unspecified: Secondary | ICD-10-CM

## 2018-03-25 DIAGNOSIS — R351 Nocturia: Secondary | ICD-10-CM

## 2018-03-25 DIAGNOSIS — G5602 Carpal tunnel syndrome, left upper limb: Secondary | ICD-10-CM

## 2018-03-25 DIAGNOSIS — J209 Acute bronchitis, unspecified: Secondary | ICD-10-CM

## 2018-03-25 DIAGNOSIS — K625 Hemorrhage of anus and rectum: Secondary | ICD-10-CM

## 2018-03-25 DIAGNOSIS — F17200 Nicotine dependence, unspecified, uncomplicated: Secondary | ICD-10-CM

## 2018-03-25 DIAGNOSIS — I10 Essential (primary) hypertension: Secondary | ICD-10-CM | POA: Insufficient documentation

## 2018-03-25 DIAGNOSIS — F17210 Nicotine dependence, cigarettes, uncomplicated: Secondary | ICD-10-CM | POA: Insufficient documentation

## 2018-03-25 LAB — POCT URINALYSIS DIP (CLINITEK)
Bilirubin, UA: NEGATIVE
Blood, UA: NEGATIVE
Glucose, UA: NEGATIVE mg/dL
Ketones, POC UA: NEGATIVE mg/dL
Leukocytes, UA: NEGATIVE
Nitrite, UA: NEGATIVE
POC,PROTEIN,UA: NEGATIVE
Spec Grav, UA: 1.02
Urobilinogen, UA: 0.2 U/dL
pH, UA: 6.5

## 2018-03-25 MED ORDER — DOXYCYCLINE HYCLATE 100 MG PO TABS: 100.0000 mg | ORAL_TABLET | Freq: Two times a day (BID) | ORAL | 0 refills | Status: DC

## 2018-03-25 MED ORDER — PREDNISONE 20 MG PO TABS: ORAL_TABLET | ORAL | 0 refills | Status: DC

## 2018-03-25 MED ORDER — RANITIDINE HCL 150 MG PO TABS: 150.0000 mg | ORAL_TABLET | Freq: Two times a day (BID) | ORAL | 11 refills | Status: DC

## 2018-03-25 MED FILL — raNITIdine HCL 150 MG TABS: 150 | 30 days supply | Qty: 60 | Fill #0

## 2018-03-25 MED FILL — predniSONE 20 MG TABS: 20 | 5 days supply | Qty: 10 | Fill #0

## 2018-03-25 MED FILL — DOXYCYCLINE HYCLATE 100 MG: 100 | 10 days supply | Qty: 20 | Fill #0

## 2018-03-25 NOTE — Telephone Encounter (Signed)
Patient was called, answered, verified dob. Patient was informed of most recent lab results and had no further questions.

## 2018-03-25 NOTE — Progress Notes (Signed)
Flu shot postpone Fist of [place] Sob for a few days  Patient states he believes he has Hemorrhoids, blood when wiping  Left arm numbness, at times Complains of gas problems [facility] [address]

## 2018-03-25 NOTE — Telephone Encounter (Signed)
-----   Message from Antony Blackbird, MD sent at 03/25/2018  3:15 PM EDT ----- Please notify patient of normal urinalysis done at today's visit

## 2018-03-25 NOTE — Progress Notes (Signed)
Subjective:    Patient ID: Jason Fritz, male    DOB: April 19, 1961, 57 y.o.   MRN: [id number]  HPI 57 year old male who was considered new to the practice as he has not been seen here since 2014.  Patient's past medical history is significant for diverticulitis, hyperlipidemia and acid reflux.  Patient has also had recurrent dental issues.  Per chart review, patient was seen 05/08/2017 at urgent care secondary to dental pain and was prescribed Augmentin and ibuprofen.      At today's visit, patient with multiple complaints.  Patient with complaint of productive cough of yellow sputum as well as mucus for more than a week.  Patient does have shortness of breath as well.  Patient also with complaint of nasal congestion and postnasal drainage.  Patient has also has some yellow nasal discharge.  Patient also with complaint of sore throat secondary to postnasal drainage.  Patient is not sure if he has had a fever.  Patient denies chest pain but states that he has had increased episodes of gas.  Patient states that he is out of medication for acid reflux.  Patient also with complaint of some numbness/tingling in his left arm which goes down to his hands.  Patient states that he often has to shake his left hand to improve the numbness.  Patient also with complaint of 2 months or more of urinary frequency including nocturia.  Patient also feels as if he does not empty his bladder completely at times.  Patient states that he also sometimes cannot make it to the restroom in time and patient has started using incontinence briefs.  Patient denies any increased thirst can no visual disturbance.  Patient denies dysuria.  Patient is not sure if he has been having any fever or chills.  Patient does feel very weak since onset of his cough as well as feeling short of breath.      Patient reports that in the past he had surgery for removal of polyps in his colon.  Patient states that over the past 2 months he has noticed some  blood in the stool/blood with wiping.  No dark stools.  Patient is not sure if he has had a change in bowel habits.  Patient states it has been sometime since he had his last colonoscopy.  Patient reports that he does smoke approximately 1 to 1-1/2 packs/day of cigarettes.  Patient drinks about 2 beers daily but drank more heavily in the past.  Patient is currently unemployed.  Patient has family history of father having multiple mini strokes and hypertension as well as diabetes.  Patient states that his mother also had hypertension as well as a stroke.  Patient states that both parents are now deceased. Past Medical History:  Diagnosis Date  . GERD (gastroesophageal reflux disease)   . Hypertension    Past Surgical History:  Procedure Laterality Date  . COLON SURGERY     had complicated diverticulitis 2005   Family History  Problem Relation Age of Onset  . Stroke Mother   . Hypertension Mother   . Stroke Father   . Diabetes Father    Social History   Tobacco Use  . Smoking status: Current Every Day Smoker    Packs/day: 0.25    Types: Cigarettes  . Smokeless tobacco: Former Systems developer  . Tobacco comment: 3 cigs/day  Substance Use Topics  . Alcohol use: Yes    Comment: occasionally  . Drug use: No  No Known Allergies  Review of Systems  Constitutional: Positive for fatigue. Negative for chills and fever.  HENT: Positive for congestion, dental problem and rhinorrhea. Negative for sore throat and trouble swallowing.   Respiratory: Positive for cough and shortness of breath.   Cardiovascular: Negative for chest pain, palpitations and leg swelling.  Gastrointestinal: Positive for blood in stool. Negative for abdominal pain.  Genitourinary: Positive for frequency. Negative for dysuria.  Musculoskeletal: Positive for arthralgias and myalgias.  Neurological: Positive for numbness and headaches (Associated with recurrent coughing). Negative for dizziness.       Objective:   Physical  Exam  BP 126/88   Pulse 92   Temp 98.5 F (36.9 C) (Oral)   Resp 18   Ht 5\' 7"  (1.702 m)   Wt 225 lb 6.4 oz (102.2 kg)   SpO2 98%   BMI 35.30 kg/m   Vital signs and nurse's note reviewed General-well-nourished, well-developed male in no acute distress but patient appears ill, patient with watery eyes, recurrent harsh cough ENT- TMs pink, nares with moderate edema of the nasal turbinates with clear to green nasal discharge, patient with tenderness over the frontal and maxillary sinuses, patient with posterior pharynx erythema with cobblestoning Neck-supple, patient with complaint of tenderness with palpation of the anterior cervical chain lymph nodes, no thyromegaly Lungs- patient with mild coarse scattered breath sounds, no accessory muscle use, no increased work of breathing Cardiovascular-regular rate and rhythm Abdomen- abdomen is slightly distended, patient was slightly hyperactive bowel sounds, patient with no tenderness to palpation.  Patient does have surgical scar which starts above the umbilicus and extends down towards the pubis Back-no CVA tenderness. Extremities- patient with mild pretibial slightly pitting edema right lower extremity, midshin to ankle      Assessment & Plan:  1. Acute bronchitis, unspecified organism Patient with bolus, possible COPD exacerbation.  Order placed for patient to obtain chest x-ray.  Patient will be placed on doxycycline and prednisone.  Patient should call or return if his symptoms worsen or are not improving - DG Chest 2 View; Future - doxycycline (VIBRA-TABS) 100 MG tablet; Take 1 tablet (100 mg total) by mouth 2 (two) times daily.  Dispense: 20 tablet; Refill: 0 - predniSONE (DELTASONE) 20 MG tablet; 2 pills once per day after a meal for 5 days  Dispense: 10 tablet; Refill: 0  2. Acute non-recurrent pansinusitis Patient with evidence of sinusitis.  Patient will be treated with doxycycline and patient is encouraged to take an  over-the-counter antihistamine to help with nasal congestion  3. Rectal bleeding Patient with complaint of noticing some rectal bleeding over the past 2 months.  Patient will have CBC at today's visit and patient will be referred to gastroenterology for further evaluation and treatment - CBC with Differential - Ambulatory referral to Gastroenterology  4. Gastroesophageal reflux disease, esophagitis presence not specified Patient with complaint of an increase in acid reflux symptoms.  Prescription provided for ranitidine - ranitidine (ZANTAC) 150 MG tablet; Take 1 tablet (150 mg total) by mouth 2 (two) times daily.  Dispense: 60 tablet; Refill: 11  5. Carpal tunnel syndrome of left wrist Patient with findings on exam suggestive of carpal tunnel syndrome.  Patient is given taper of prednisone to help with discomfort left hand as well as to help with pulmonary irritation related to current bronchitis - predniSONE (DELTASONE) 20 MG tablet; 2 pills once per day after a meal for 5 days  Dispense: 10 tablet; Refill: 0  6. Urinary frequency Patient with complaint of recent  onset of frequency and patient will have CMP, urinalysis and hemoglobin A1c to look for possible electrolyte abnormalities, urinary tract infection or diabetes as a cause of or resulting from his urinary frequency - Comprehensive metabolic panel - POCT URINALYSIS DIP (CLINITEK) - Hemoglobin A1c  7. Nocturia Patient with complaint of urinary frequency including nocturia.  Patient is asked to cut back his water intake at least 2 to 3 hours prior to bedtime.  Patient will also have urinalysis to look for possible urinary tract infection as well as PSA to look for prostate abnormality that may be contributing to his urinary frequency and nocturia - POCT URINALYSIS DIP (CLINITEK) - PSA  8. Dyspnea, unspecified type Patient with complaint of shortness of breath and patient was asked to obtain chest x-ray, order placed at today's  visit - DG Chest 2 View; Future  9. Tobacco dependence Patient with continued tobacco dependence and patient is encouraged to make a plan to reduce his amount of tobacco use and set a quit date for complete cessation of smoking  An After Visit Summary was printed and given to the patient.  Return in about 1 week (around 04/01/2018).

## 2018-03-26 LAB — CBC WITH DIFFERENTIAL/PLATELET
Basophils Absolute: 0.1 x10E3/uL (ref 0.0–0.2)
Basos: 1 %
EOS (ABSOLUTE): 0.3 x10E3/uL (ref 0.0–0.4)
Eos: 5 %
Hematocrit: 42.7 % (ref 37.5–51.0)
Hemoglobin: 14.8 g/dL (ref 13.0–17.7)
Immature Grans (Abs): 0.1 x10E3/uL (ref 0.0–0.1)
Immature Granulocytes: 1 %
Lymphocytes Absolute: 2.3 x10E3/uL (ref 0.7–3.1)
Lymphs: 38 %
MCH: 31.2 pg (ref 26.6–33.0)
MCHC: 34.7 g/dL (ref 31.5–35.7)
MCV: 90 fL (ref 79–97)
Monocytes Absolute: 0.7 x10E3/uL (ref 0.1–0.9)
Monocytes: 11 %
Neutrophils Absolute: 2.6 x10E3/uL (ref 1.4–7.0)
Neutrophils: 44 %
Platelets: 228 x10E3/uL (ref 150–450)
RBC: 4.74 x10E6/uL (ref 4.14–5.80)
RDW: 12.5 % (ref 12.3–15.4)
WBC: 5.9 x10E3/uL (ref 3.4–10.8)

## 2018-03-26 LAB — HEMOGLOBIN A1C
Est. average glucose Bld gHb Est-mCnc: 120 mg/dL
Hgb A1c MFr Bld: 5.8 % — ABNORMAL HIGH (ref 4.8–5.6)

## 2018-03-26 LAB — PSA: Prostate Specific Ag, Serum: 0.2 ng/mL (ref 0.0–4.0)

## 2018-03-26 LAB — COMPREHENSIVE METABOLIC PANEL WITH GFR
ALT: 18 IU/L (ref 0–44)
AST: 16 IU/L (ref 0–40)
Albumin/Globulin Ratio: 1.8 (ref 1.2–2.2)
Albumin: 4.3 g/dL (ref 3.5–5.5)
Alkaline Phosphatase: 62 IU/L (ref 39–117)
BUN/Creatinine Ratio: 16 (ref 9–20)
BUN: 18 mg/dL (ref 6–24)
Bilirubin Total: <0.2 mg/dL (ref 0.0–1.2)
CO2: 18 mmol/L — ABNORMAL LOW (ref 20–29)
Calcium: 9.1 mg/dL (ref 8.7–10.2)
Chloride: 105 mmol/L (ref 96–106)
Creatinine, Ser: 1.16 mg/dL (ref 0.76–1.27)
GFR calc Af Amer: 80 mL/min/1.73
GFR calc non Af Amer: 69 mL/min/1.73
Globulin, Total: 2.4 g/dL (ref 1.5–4.5)
Glucose: 121 mg/dL — ABNORMAL HIGH (ref 65–99)
Potassium: 4.8 mmol/L (ref 3.5–5.2)
Sodium: 139 mmol/L (ref 134–144)
Total Protein: 6.7 g/dL (ref 6.0–8.5)

## 2018-03-27 ENCOUNTER — Ambulatory Visit: Payer: Self-pay | Attending: Family Medicine

## 2018-04-01 ENCOUNTER — Telehealth: Payer: Self-pay

## 2018-04-01 NOTE — Telephone Encounter (Signed)
Patient asked cma about referral for gastro. No info found. Please fu at your earliest convenience.

## 2018-04-01 NOTE — Telephone Encounter (Signed)
-----   Message from Antony Blackbird, MD sent at 03/26/2018 12:09 PM EDT ----- Normal CBC. Glucose was increased at 121 on CMP. Normal PSA. Hgb A1c was 5.8 indicating an increased risk of developing diabetes. Please follow a low carbohydrate diet, avoid concentrated sweets-soda, sweet tea, breads, pasta dishes, desserts

## 2018-04-01 NOTE — Telephone Encounter (Signed)
Pt do not have insurance I send a letter to patient to apply for the [facility] discount [employer] with application  to be refer to a Gi specialist.  03/25/18 .  I spoke to him  And Patient is aware  Of that  he is waiting for the [facility] Discount letter by mail so I can proceed with the referral

## 2018-04-01 NOTE — Telephone Encounter (Signed)
Patient was called, verified DOB, and was given most recent lab results. Patient verbalized understanding. Patient wanted to inform pcp that he has ben having sob as well as sweats from time to time. Patient stated he would follow up with pcp at appointment 04/10/18.

## 2018-04-01 NOTE — Telephone Encounter (Signed)
A referral to GI was placed at patient's appointment on 03/25/18. Please contact Jason Fritz to find out information regarding the referral so that you can inform the patient.

## 2018-04-06 ENCOUNTER — Telehealth: Payer: Self-pay | Admitting: Family Medicine

## 2018-04-06 NOTE — Telephone Encounter (Signed)
Patient called concerned about his referral. He is still experiencing pain. Please follow up with patient.

## 2018-04-06 NOTE — Telephone Encounter (Signed)
Will forward to Alinda Sierras to see if she can work on referral

## 2018-04-07 NOTE — Telephone Encounter (Signed)
Referral was sent yesterday  To [facility] . They will contact the patient to schedule an appointment

## 2018-04-10 ENCOUNTER — Emergency Department (HOSPITAL_COMMUNITY): Payer: Self-pay

## 2018-04-10 ENCOUNTER — Encounter (HOSPITAL_COMMUNITY): Payer: Self-pay

## 2018-04-10 ENCOUNTER — Ambulatory Visit: Payer: Self-pay | Attending: Family Medicine | Admitting: Family Medicine

## 2018-04-10 ENCOUNTER — Other Ambulatory Visit: Payer: Self-pay

## 2018-04-10 ENCOUNTER — Emergency Department (HOSPITAL_COMMUNITY)
Admission: EM | Admit: 2018-04-10 | Discharge: 2018-04-10 | Disposition: A | Discharge status: Home / Self Care | Payer: Self-pay | Attending: Emergency Medicine | Admitting: Emergency Medicine

## 2018-04-10 ENCOUNTER — Encounter: Payer: Self-pay | Admitting: Family Medicine

## 2018-04-10 VITALS — BP 152/108 | HR 94 | Temp 98.3°F | Wt 219.0 lb

## 2018-04-10 DIAGNOSIS — R1013 Epigastric pain: Secondary | ICD-10-CM

## 2018-04-10 DIAGNOSIS — R1084 Generalized abdominal pain: Secondary | ICD-10-CM | POA: Insufficient documentation

## 2018-04-10 DIAGNOSIS — F17210 Nicotine dependence, cigarettes, uncomplicated: Secondary | ICD-10-CM | POA: Insufficient documentation

## 2018-04-10 DIAGNOSIS — R0789 Other chest pain: Secondary | ICD-10-CM

## 2018-04-10 DIAGNOSIS — I10 Essential (primary) hypertension: Secondary | ICD-10-CM | POA: Insufficient documentation

## 2018-04-10 DIAGNOSIS — Z79899 Other long term (current) drug therapy: Secondary | ICD-10-CM | POA: Insufficient documentation

## 2018-04-10 DIAGNOSIS — K219 Gastro-esophageal reflux disease without esophagitis: Secondary | ICD-10-CM | POA: Insufficient documentation

## 2018-04-10 DIAGNOSIS — Z8249 Family history of ischemic heart disease and other diseases of the circulatory system: Secondary | ICD-10-CM | POA: Insufficient documentation

## 2018-04-10 DIAGNOSIS — R200 Anesthesia of skin: Secondary | ICD-10-CM

## 2018-04-10 LAB — URINALYSIS, ROUTINE W REFLEX MICROSCOPIC
BILIRUBIN URINE: NEGATIVE
GLUCOSE, UA: NEGATIVE mg/dL
HGB URINE DIPSTICK: NEGATIVE
KETONES UR: NEGATIVE mg/dL
Leukocytes, UA: NEGATIVE
Nitrite: NEGATIVE
PH: 5 (ref 5.0–8.0)
PROTEIN: NEGATIVE mg/dL
Specific Gravity, Urine: 1.017 (ref 1.005–1.030)

## 2018-04-10 LAB — COMPREHENSIVE METABOLIC PANEL
ALBUMIN: 3.7 g/dL (ref 3.5–5.0)
ALK PHOS: 50 U/L (ref 38–126)
ALT: 22 U/L (ref 0–44)
ANION GAP: 9 (ref 5–15)
AST: 18 U/L (ref 15–41)
BILIRUBIN TOTAL: 0.7 mg/dL (ref 0.3–1.2)
BUN: 17 mg/dL (ref 6–20)
CALCIUM: 9.3 mg/dL (ref 8.9–10.3)
CO2: 21 mmol/L — ABNORMAL LOW (ref 22–32)
CREATININE: 0.99 mg/dL (ref 0.61–1.24)
Chloride: 107 mmol/L (ref 98–111)
GFR calc Af Amer: >60 mL/min (ref >60)
GFR calc non Af Amer: >60 mL/min (ref >60)
GLUCOSE: 114 mg/dL — AB (ref 70–99)
Potassium: 4.4 mmol/L (ref 3.5–5.1)
Sodium: 137 mmol/L (ref 135–145)
Total Protein: 7 g/dL (ref 6.5–8.1)

## 2018-04-10 LAB — CBC
HCT: 45.3 % (ref 39.0–52.0)
Hemoglobin: 15.2 g/dL (ref 13.0–17.0)
MCH: 32.1 pg (ref 26.0–34.0)
MCHC: 33.6 g/dL (ref 30.0–36.0)
MCV: 95.8 fL (ref 78.0–100.0)
Platelets: 225 10*3/uL (ref 150–400)
RBC: 4.73 MIL/uL (ref 4.22–5.81)
RDW: 13.2 % (ref 11.5–15.5)
WBC: 8.1 10*3/uL (ref 4.0–10.5)

## 2018-04-10 LAB — I-STAT TROPONIN, ED: TROPONIN I, POC: 0 ng/mL (ref 0.00–0.08)

## 2018-04-10 LAB — LIPASE, BLOOD: Lipase: 22 U/L (ref 11–51)

## 2018-04-10 MED ORDER — IOPAMIDOL (ISOVUE-300) INJECTION 61%
100.0000 mL | Freq: Once | INTRAVENOUS | Status: AC | PRN
  Administered 2018-04-10: 100 mL via INTRAVENOUS

## 2018-04-10 MED ORDER — KETOROLAC TROMETHAMINE 30 MG/ML IJ SOLN
30.0000 mg | Freq: Once | INTRAMUSCULAR | Status: AC
  Administered 2018-04-10: 30 mg via INTRAVENOUS
  Filled 2018-04-10: qty 1

## 2018-04-10 MED ORDER — DICYCLOMINE HCL 20 MG PO TABS: 20.0000 mg | ORAL_TABLET | Freq: Two times a day (BID) | ORAL | 0 refills | Status: DC

## 2018-04-10 MED ORDER — ASPIRIN 81 MG PO CHEW: 324.0000 mg | CHEWABLE_TABLET | Freq: Once | ORAL | Status: AC

## 2018-04-10 MED ORDER — PANTOPRAZOLE SODIUM 40 MG PO TBEC: 40.0000 mg | DELAYED_RELEASE_TABLET | Freq: Two times a day (BID) | ORAL | 3 refills | Status: DC

## 2018-04-10 NOTE — ED Triage Notes (Signed)
Pt from PCP office with ems for epigastric pain/cramping that started at 830 this morning while taking a shower, pt states it comes ans goes. Denies any n/v or dizziness, no cardiac hx. Pt a.o, nad. Given 324 ASA at pcp office today. 20G in LAC  BP 140/96 HR98

## 2018-04-10 NOTE — Discharge Instructions (Signed)
Your CT scan did not show any abnormalities at this time.  You will need to follow-up with your primary doctor.  At this time we do not see any abnormalities but will give you treatment for this spasming type sensation.

## 2018-04-10 NOTE — ED Provider Notes (Signed)
[facility] EMERGENCY DEPARTMENT Provider Note   CSN: [id number] Arrival date & time: 04/10/18  1118     History   Chief Complaint Chief Complaint  Patient presents with  . Chest Pain    HPI Jason Fritz is a 57 y.o. male.  HPI Patient presents to the emergency department with upper abdominal pain that is diffuse throughout his abdomen.  The patient describes it as a crampy type sensation that is intermittent.  The patient states it comes in short bursts.  The patient states that he does not have any nausea or vomiting with this.  The patient states that he has no chest pain even though that is mentioned in the notes from the doctor's office.  The patient states that nothing seems to make the condition better or worse.  He states that he did notice this over the last few weeks as well.  The patient states that he has had no fevers associated with this either.  Patient was given 324 of aspirin by his primary doctor's office.  The patient denies chest pain, shortness of breath, headache,blurred vision, neck pain, fever, cough, weakness, numbness, dizziness, anorexia, edema, , nausea, vomiting, diarrhea, rash, back pain, dysuria, hematemesis, bloody stool, near syncope, or syncope. Past Medical History:  Diagnosis Date  . GERD (gastroesophageal reflux disease)   . Hypertension     Patient Active Problem List   Diagnosis Date Noted  . Unspecified constipation 04/29/2013  . Dental abscess 04/29/2013  . OTITIS MEDIA, ACUTE, RIGHT 04/02/2010  . FATIGUE 11/15/2009  . EAR PAIN, RIGHT 11/16/2008  . DENTAL DISORDER 03/22/2008  . ERECTILE DYSFUNCTION 01/21/2008  . CONSTIPATION, CHRONIC 01/21/2008  . 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 ELEVATED BLOOD PRESSURE WITHOUT DIAGNOSIS OF HYPERTENSION 12/04/2007  . TOBACCO USER 11/22/2006  . DEPRESSION 11/22/2006  . ALLERGIC RHINITIS 11/22/2006  . GERD 11/22/2006  . DIVERTICULITIS, HX OF 11/22/2006    Past Surgical  History:  Procedure Laterality Date  . COLON SURGERY     had complicated diverticulitis 2005        Home Medications    Prior to Admission medications   Medication Sig Start Date End Date Taking? Authorizing Provider  ibuprofen (ADVIL,MOTRIN) 200 MG tablet Take 200 mg by mouth every 6 (six) hours as needed for mild pain.   Yes [provider]  ibuprofen (ADVIL,MOTRIN) 800 MG tablet Take 1 tablet (800 mg total) by mouth 3 (three) times daily. Patient not taking: Reported on 03/25/2018 05/08/17   Augusto Gamble B, NP  omeprazole (PRILOSEC) 20 MG capsule Take 1 capsule (20 mg total) by mouth daily. Patient not taking: Reported on 03/25/2018 04/29/13   Rai, Vernelle Emerald, MD  ranitidine (ZANTAC) 150 MG tablet Take 1 tablet (150 mg total) by mouth 2 (two) times daily. Patient not taking: Reported on 04/10/2018 03/25/18   Fulp, Cammie, MD  senna-docusate (SENOKOT-S) 8.6-50 MG per tablet Take 1 tablet by mouth at bedtime. For constipation Patient not taking: Reported on 03/25/2018 04/29/13   Mendel Corning, MD    Family History Family History  Problem Relation Age of Onset  . Stroke Mother   . Hypertension Mother   . Stroke Father   . Diabetes Father     Social History Social History   Tobacco Use  . Smoking status: Current Every Day Smoker    Packs/day: 0.25    Types: Cigarettes  . Smokeless tobacco: Former Systems developer  . Tobacco comment: 3 cigs/day  Substance Use Topics  .  Alcohol use: Yes    Comment: occasionally  . Drug use: No     Allergies   Patient has no known allergies.   Review of Systems Review of Systems All other systems negative except as documented in the HPI. All pertinent positives and negatives as reviewed in the HPI.  Physical Exam Updated Vital Signs BP 122/87   Pulse 99   Temp 97.6 F (36.4 C) (Oral)   Resp 15   Ht 5\' 7"  (1.702 m)   Wt 99.3 kg   SpO2 93%   BMI 34.29 kg/m   Physical Exam  Constitutional: He is oriented to person, place,  and time. He appears well-developed and well-nourished. No distress.  HENT:  Head: Normocephalic and atraumatic.  Mouth/Throat: Oropharynx is clear and moist.  Eyes: Pupils are equal, round, and reactive to light.  Neck: Normal range of motion. Neck supple.  Cardiovascular: Normal rate, regular rhythm and normal heart sounds. Exam reveals no gallop and no friction rub.  No murmur heard. Pulmonary/Chest: Effort normal and breath sounds normal. No respiratory distress. He has no wheezes.  Abdominal: Soft. Bowel sounds are normal. He exhibits no distension, no ascites and no mass. There is no hepatomegaly. There is generalized tenderness and tenderness in the right lower quadrant, epigastric area, periumbilical area and left upper quadrant. There is no rigidity, no rebound, no guarding and negative Murphy's sign.  Neurological: He is alert and oriented to person, place, and time. He exhibits normal muscle tone. Coordination normal.  Skin: Skin is warm and dry. Capillary refill takes less than 2 seconds. No rash noted. No erythema.  Psychiatric: He has a normal mood and affect. His behavior is normal.  Nursing note and vitals reviewed.    ED Treatments / Results  Labs (all labs ordered are listed, but only abnormal results are displayed) Labs Reviewed  COMPREHENSIVE METABOLIC PANEL - Abnormal; Notable for the following components:      Result Value   CO2 21 (*)    Glucose, Bld 114 (*)    All other components within normal limits  CBC  LIPASE, BLOOD  URINALYSIS, ROUTINE W REFLEX MICROSCOPIC  I-STAT TROPONIN, ED    EKG EKG Interpretation  Date/Time:  Friday April 10 2018 11:25:48 EDT Ventricular Rate:  100 PR Interval:    QRS Duration: 91 QT Interval:  354 QTC Calculation: 457 R Axis:   76 Text Interpretation:  Sinus tachycardia Ventricular premature complex Confirmed by Gerlene Fee [phone]) on 04/10/2018 1:07:18 PM   Radiology Dg Chest 2 View  Result Date:  04/10/2018 CLINICAL DATA:  57 year old male with a history epigastric pain EXAM: CHEST - 2 VIEW COMPARISON:  03/25/2018, 04/29/2016 FINDINGS: Cardiomediastinal silhouette unchanged in size and contour. No evidence of central vascular congestion. No pneumothorax or pleural effusion. No confluent airspace disease. No displaced fracture. Coarsened interstitial markings, similar to prior. IMPRESSION: Negative for acute cardiopulmonary disease Electronically Signed   By: Corrie Mckusick D.O.   On: 04/10/2018 13:16    Procedures Procedures (including critical care time)  Medications Ordered in ED Medications  iopamidol (ISOVUE-300) 61 % injection 100 mL (100 mLs Intravenous Contrast Given 04/10/18 1447)     Initial Impression / Assessment and Plan / ED Course  I have reviewed the triage vital signs and the nursing notes.  Pertinent labs & imaging results that were available during my care of the patient were reviewed by me and considered in my medical decision making (see chart for details).    Patient's  diffuse abdominal discomfort on palpation does not follow to a specific pattern that would be causing this.  The patient does not have any chest pain or shortness of breath associated with this.  The patient has had this ongoing for several weeks but then it is gotten worse today.  The patient does not have just upper abdominal pain that would lead itself to believe that it is a chest etiology at this time.  Certainly this cannot be totally excluded but feel fairly confident based on her laboratory testing and CT scan results.  The patient is advised to return for any worsening in his condition told to return here as needed.  Final Clinical Impressions(s) / ED Diagnoses   Final diagnoses:  None    ED Discharge Orders    None       Dalia Heading, PA-C 04/10/18 1546    Maudie Flakes, MD 04/10/18 [phone]

## 2018-04-10 NOTE — Progress Notes (Addendum)
Subjective:    Patient ID: Jason Fritz, male    DOB: 31-Aug-1960, 57 y.o.   MRN: [id number]  HPI 57 year old male who had an appointment today in follow-up of recent visit at which time patient was diagnosed with bronchitis, possible COPD, reflux and patient with complaint of recent onset of rectal bleeding.  At today's visit, patient states he has not had any additional rectal bleeding but has not been contacted regarding a GI referral.  Patient states that he never had the antibiotic field at his last visit for the bronchitis.  Patient still with some occasional cough that is nonproductive.  Patient however with episode of acute distress when he was called back by the nursing staff.  Patient states that for the past few days, he will have sudden onset of pain in his mid upper abdomen that seems to radiate down to his lower abdomen.  Patient states that he will feel as if he cannot walk when he gets his acute onset of pain.  Patient also states that he has had about 2 weeks of his left arm suddenly going numb feeling as if it is asleep.  Patient does have family history significant for mother and father with hypertension as well as father with a stroke and mother has had a heart attack.  Patient admits that he continues to smoke approximately 6 cigarettes/day as well as at least 2 beers per night which is likely more than that number.      Patient states that he gets nausea as well as feeling sweaty when he has the mid upper abdominal discomfort.  Patient describes this as a sharp pain that lasts for about a minute. Past Medical History:  Diagnosis Date  . GERD (gastroesophageal reflux disease)   . Hypertension    Past Surgical History:  Procedure Laterality Date  . COLON SURGERY     had complicated diverticulitis 2005   Family History  Problem Relation Age of Onset  . Stroke Mother   . Hypertension Mother   . Stroke Father   . Diabetes Father    Social History   Tobacco Use  .  Smoking status: Current Every Day Smoker    Packs/day: 0.25    Types: Cigarettes  . Smokeless tobacco: Former Systems developer  . Tobacco comment: 3 cigs/day  Substance Use Topics  . Alcohol use: Yes    Comment: occasionally  . Drug use: No  No Known Allergies   Review of Systems  Constitutional: Positive for diaphoresis and fatigue. Negative for chills and fever.  HENT: Negative for sore throat and trouble swallowing.   Respiratory: Positive for shortness of breath (Occasional). Negative for cough.   Cardiovascular: Negative for chest pain, palpitations and leg swelling.  Gastrointestinal: Positive for abdominal pain, blood in stool (Previously but not within the last 2 to 3 weeks) and nausea.  Genitourinary: Negative for dysuria and frequency.  Musculoskeletal: Positive for arthralgias. Negative for back pain.  Neurological: Positive for light-headedness (Occasional). Negative for headaches.       Objective:   Physical Exam BP (!) 152/108   Pulse 94   Temp 98.3 F (36.8 C) (Oral)   Wt 219 lb (99.3 kg)   SpO2 98%   BMI 34.30 kg/m  BP (!) 152/108   Pulse 94   Temp 98.3 F (36.8 C) (Oral)   Wt 219 lb (99.3 kg)   SpO2 98%   BMI 34.30 kg/m Vital signs and nurse's notes reviewed General-well-nourished, well-developed overweight male who  appears anxious Neck-supple, no lymphadenopathy, no thyromegaly Cardiovascular- mild tachycardia, regular rhythm Lungs-clear to auscultation bilaterally Abdomen- patient with abdominal distention, patient with evidence of healed vertical surgical scar below the umbilicus (history of complicated diverticulitis); patient with tenderness to palpation in the epigastric area, no rebound or guarding Back-no CVA tenderness Extremities-no edema Musculoskeletal-no reproducible left shoulder pain with palpation  Chest- patient with tenderness to palpation and withdrawal with palpation over the tip of the sternum    Assessment & Plan:  1. Atypical chest  pain Patient with atypical chest pain as he states that the pain is mostly in the epigastric area but patient has also had episodes of his left arm going numb.  Patient's EKG did not show any acute T wave abnormality but due to patient's symptoms, patient will be transported to the emergency department for further evaluation and treatment.  EMS was activated.  Patient was given 3, 81 mg aspirin to chew and placed on 2 L of oxygen by nasal cannula. - EKG 12-Lead - aspirin chewable tablet 324 mg  2. Epigastric pain Patient with epigastric pain on examination.  Patient was recently placed on twice daily ranitidine for acid reflux.  Patient does have history of daily alcohol consumption so pain could be secondary to gastritis, ulcer or pancreatitis.  Patient's epigastric pain could also represent atypical representation of heart disease though it is unlikely that he would have pain with palpation in this area with heart disease alone.  Patient is also had history of rectal bleeding but has not heard from gastroenterology therefore scheduler will be contacted regarding the status of his referral.  3. Left arm numbness Patient with complaint of recurrent sensation of the left arm going completely numb as if it is asleep.  Patient did not have any reproducible shoulder pain on examination.  Patient is being sent to the emergency room for evaluation for possible cardiac event.  Return for Follow-up after emergency department evaluation.   Addendum: Patient presented to the clinic after 4 PM today telling staff that he had been released from the emergency department.  Patient stated that he needed to be seen to have medications filled from his ED visit.  On review of ED notes, patient was not prescribed any new medications.  Nurses spoke with the patient to let him know that I had offered to change in increase his reflux medication if he would like to do so.  Patient was in agreement and new prescription sent to  the pharmacy for pantoprazole 40 mg twice daily and patient will need follow-up appointment in a few weeks.

## 2018-04-10 NOTE — Addendum Note (Signed)
Addended by: Kathrene Bongo on: 04/10/2018 04:39 PM   Modules accepted: Orders

## 2018-04-10 NOTE — ED Notes (Signed)
Patient transported to CT 

## 2018-04-10 NOTE — ED Notes (Signed)
Patient transported to X-ray 

## 2018-04-21 NOTE — Progress Notes (Signed)
Patient ID: Jason Fritz, male   DOB: 01-10-1961, 57 y.o.   MRN: [id number]      Jason Fritz, is a 57 y.o. male  KGU:[id number]  [id number]:[id number]  DOB - Oct 10, 1960  Subjective:  Chief Complaint and HPI: Jason Fritz is a 57 y.o. male here today for a follow up visit After being seen in  The ED 9/27 for abdominal pain.  Abdominal pain much improved.  Still having some dyspepsia and burping.  No fever.    Tooth pain in R upper jaw.  H/o abscess there and feels like he has that again.  Also having post-nasal drip and cough.    Also trouble going to sleep.  Took elavil in the past and would like to take it again.    From ED note: HPI: Patient presents to the emergency department with upper abdominal pain that is diffuse throughout his abdomen.  The patient describes it as a crampy type sensation that is intermittent.  The patient states it comes in short bursts.  The patient states that he does not have any nausea or vomiting with this.  The patient states that he has no chest pain even though that is mentioned in the notes from the doctor's office.  The patient states that nothing seems to make the condition better or worse.  He states that he did notice this over the last few weeks as well.  The patient states that he has had no fevers associated with this either.  Patient was given 324 of aspirin by his primary doctor's office.  The patient denies chest pain, shortness of breath, headache,blurred vision, neck pain, fever, cough, weakness, numbness, dizziness, anorexia, edema, , nausea, vomiting, diarrhea, rash, back pain, dysuria, hematemesis, bloody stool, near syncope, or syncope.  EKG with sinus tachycardia and PVC.  No ST changes and cardiac enzymes negative.    From A/P The patient does not have any chest pain or shortness of breath associated with this.  The patient has had this ongoing for several weeks but then it is gotten worse today.  The patient does not have just upper  abdominal pain that would lead itself to believe that it is a chest etiology at this time.  Certainly this cannot be totally excluded but feel fairly confident based on her laboratory testing and CT scan results.  The patient is advised to return for any worsening in his condition told to return here as needed.  CT: IMPRESSION: 1. No acute abnormalities to explain the patient's symptoms. 2. 3 mm nodule in the left lung base. No follow-up needed if patient is low-risk. Non-contrast chest CT can be considered in 12 months if patient is high-risk. This recommendation follows the consensus statement: Guidelines for Management of Incidental Pulmonary Nodules Detected on CT Images: From the Fleischner Society 2017; Radiology 2017; 284:228-243. 3. 14 mm left adrenal nodule containing a calcification. Recommend a follow-up CT scan with an adrenal protocol in 1 year. 4. Nonobstructive stones in both kidneys. 5. Scattered colonic diverticuli without diverticulitis. The appendix is not visualized. 6. 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 7. Fat containing right inguinal hernia.  ED/Hospital notes reviewed and summarized above Social:  Not working right now  ROS:   Constitutional:  No f/c, No night sweats, No unexplained weight loss. EENT:  No vision changes, No blurry vision, No hearing changes. No additional mouth, throat, or ear problems.  Respiratory: +mild cough, No SOB Cardiac: No CP,  no palpitations GI:  No abd pain, No N/V/D. GU: No Urinary s/sx Musculoskeletal: No joint pain Neuro: No headache, no dizziness, no motor weakness.  Skin: No rash Endocrine:  No polydipsia. No polyuria.  Psych: Denies SI/HI  No problems updated.  ALLERGIES: No Known Allergies  PAST MEDICAL HISTORY: Past Medical History:  Diagnosis Date  . GERD (gastroesophageal reflux disease)   . Hypertension     MEDICATIONS AT HOME: Prior to Admission  medications   Medication Sig Start Date End Date Taking? Authorizing Provider  amitriptyline (ELAVIL) 25 MG tablet 1-2 at bedtime prn insomnia 04/22/18   Freeman Caldron M, PA-C  amoxicillin (AMOXIL) 500 MG capsule Take 1 capsule (500 mg total) by mouth 3 (three) times daily. 04/22/18   Argentina Donovan, PA-C  benzonatate (TESSALON) 100 MG capsule Take 2 capsules (200 mg total) by mouth 3 (three) times daily as needed for cough. 04/22/18   Argentina Donovan, PA-C  dicyclomine (BENTYL) 20 MG tablet Take 1 tablet (20 mg total) by mouth 2 (two) times daily. 04/10/18   Lawyer, Harrell Gave, PA-C  ibuprofen (ADVIL,MOTRIN) 200 MG tablet Take 200 mg by mouth every 6 (six) hours as needed for mild pain.    [provider]  ibuprofen (ADVIL,MOTRIN) 800 MG tablet Take 1 tablet (800 mg total) by mouth 3 (three) times daily. Patient not taking: Reported on 03/25/2018 05/08/17   Augusto Gamble B, NP  pantoprazole (PROTONIX) 40 MG tablet Take 1 tablet (40 mg total) by mouth 2 (two) times daily. To reduce stomach acid 04/10/18   Fulp, Cammie, MD  ranitidine (ZANTAC) 150 MG tablet Take 1 tablet (150 mg total) by mouth 2 (two) times daily. Patient not taking: Reported on 04/10/2018 03/25/18   Fulp, Cammie, MD  senna-docusate (SENOKOT-S) 8.6-50 MG per tablet Take 1 tablet by mouth at bedtime. For constipation Patient not taking: Reported on 03/25/2018 04/29/13   Rai, Vernelle Emerald, MD     Objective:  EXAM:   Vitals:   04/22/18 0912  BP: (!) 131/91  Pulse: (!) 102  Resp: 18  Temp: 98.2 F (36.8 C)  TempSrc: Oral  SpO2: 96%  Weight: 219 lb 6.4 oz (99.5 kg)  Height: 5\' 9"  (1.753 m)    General appearance : A&OX3. NAD. Non-toxic-appearing HEENT: Atraumatic and Normocephalic.  PERRLA. EOM intact.  TM clear B.  R upper jaw tooth 4-5 -mild swelling at gum.   Mouth-MMM, post pharynx WNL w/o erythema, No PND. Neck: supple, no JVD. No cervical lymphadenopathy. No thyromegaly Chest/Lungs:  Breathing-non-labored,  Good air entry bilaterally, breath sounds normal without rales, rhonchi, or wheezing  CVS: S1 S2 regular, no murmurs, gallops, rubs  Abdomen: Bowel sounds present, Non tender and not distended with no gaurding, rigidity or rebound. Extremities: Bilateral Lower Ext shows no edema, both legs are warm to touch with = pulse throughout Neurology:  CN II-XII grossly intact, Non focal.   Psych:  TP linear. J/I WNL. Normal speech. Appropriate eye contact and affect.  Skin:  No Rash  Data Review Lab Results  Component Value Date   HGBA1C 5.8 (H) 03/25/2018     Assessment & Plan   1. Dyspepsia No red flags - H. pylori breath test  2. Encounter for examination following treatment at hospital Much improved  3. Abnormal findings on diagnostic imaging of lung - CT Abdomen Pelvis W Contrast; Future  4. Tooth abscess amoxicillin 500 tid X 10 days  5. Cough Tessalon perles 200mg  tid prn  6. Solitary  pulmonary nodule on lung CT Future CT-1 year  7. Adrenal nodule (HCC) Future CT-1 year  8. Insomnia, unspecified type Elavil 25-50mg  prn sleep   Patient have been counseled extensively about nutrition and exercise  Return in about 1 month (around 05/23/2018) for Dr Chapman Fitch, insomnia.  The patient was given clear instructions to go to ER or return to medical center if symptoms don't improve, worsen or new problems develop. The patient verbalized understanding. The patient was told to call to get lab results if they haven't heard anything in the next week.     Freeman Caldron, PA-C [facility], [place]   04/22/2018, 9:37 AM

## 2018-04-22 ENCOUNTER — Ambulatory Visit: Payer: Self-pay | Attending: Family Medicine | Admitting: Physician Assistant

## 2018-04-22 ENCOUNTER — Other Ambulatory Visit: Payer: Self-pay

## 2018-04-22 VITALS — BP 131/91 | HR 102 | Temp 98.2°F | Resp 18 | Ht 69.0 in | Wt 219.4 lb

## 2018-04-22 DIAGNOSIS — R059 Cough, unspecified: Secondary | ICD-10-CM

## 2018-04-22 DIAGNOSIS — K4090 Unilateral inguinal hernia, without obstruction or gangrene, not specified as recurrent: Secondary | ICD-10-CM | POA: Insufficient documentation

## 2018-04-22 DIAGNOSIS — I10 Essential (primary) hypertension: Secondary | ICD-10-CM | POA: Insufficient documentation

## 2018-04-22 DIAGNOSIS — N200 Calculus of kidney: Secondary | ICD-10-CM | POA: Insufficient documentation

## 2018-04-22 DIAGNOSIS — R911 Solitary pulmonary nodule: Secondary | ICD-10-CM

## 2018-04-22 DIAGNOSIS — R05 Cough: Secondary | ICD-10-CM | POA: Insufficient documentation

## 2018-04-22 DIAGNOSIS — Z09 Encounter for follow-up examination after completed treatment for conditions other than malignant neoplasm: Secondary | ICD-10-CM

## 2018-04-22 DIAGNOSIS — Z79899 Other long term (current) drug therapy: Secondary | ICD-10-CM | POA: Insufficient documentation

## 2018-04-22 DIAGNOSIS — R1013 Epigastric pain: Secondary | ICD-10-CM

## 2018-04-22 DIAGNOSIS — K047 Periapical abscess without sinus: Secondary | ICD-10-CM

## 2018-04-22 DIAGNOSIS — G4700 Insomnia, unspecified: Secondary | ICD-10-CM

## 2018-04-22 DIAGNOSIS — E278 Other specified disorders of adrenal gland: Secondary | ICD-10-CM

## 2018-04-22 DIAGNOSIS — Z23 Encounter for immunization: Secondary | ICD-10-CM

## 2018-04-22 DIAGNOSIS — K219 Gastro-esophageal reflux disease without esophagitis: Secondary | ICD-10-CM | POA: Insufficient documentation

## 2018-04-22 DIAGNOSIS — E279 Disorder of adrenal gland, unspecified: Secondary | ICD-10-CM

## 2018-04-22 DIAGNOSIS — R918 Other nonspecific abnormal finding of lung field: Secondary | ICD-10-CM

## 2018-04-22 MED ORDER — AMITRIPTYLINE HCL 25 MG PO TABS: ORAL_TABLET | ORAL | 2 refills | Status: DC

## 2018-04-22 MED ORDER — AMOXICILLIN 500 MG PO CAPS: 500.0000 mg | ORAL_CAPSULE | Freq: Three times a day (TID) | ORAL | 0 refills | Status: DC

## 2018-04-22 MED ORDER — BENZONATATE 100 MG PO CAPS: 200.0000 mg | ORAL_CAPSULE | Freq: Three times a day (TID) | ORAL | 0 refills | Status: DC | PRN

## 2018-04-22 MED FILL — BENZONATATE 100 MG CAP: 100 | 7 days supply | Qty: 40 | Fill #0

## 2018-04-22 MED FILL — ?AMITRIPTYLINE HCL 25 MG TA: 25 | 30 days supply | Qty: 60 | Fill #0

## 2018-04-22 MED FILL — AMOXICILLIN 500 MG CAPSULE: 500 | 10 days supply | Qty: 30 | Fill #0

## 2018-04-22 NOTE — Progress Notes (Signed)
Left arm still wants to go to sleep. Believes he has a tooth abscess on the top right.  Pain: 0 Flu and pn

## 2018-04-23 LAB — H. PYLORI BREATH TEST: H PYLORI BREATH TEST: NEGATIVE

## 2018-04-24 ENCOUNTER — Telehealth: Payer: Self-pay

## 2018-04-24 NOTE — Telephone Encounter (Signed)
Patient was called, answered, verified dob, and was given most recent h.pylori result. Patient verbalized understanding and had no further questions.

## 2018-04-24 NOTE — Telephone Encounter (Signed)
-----   Message from Argentina Donovan, [place] sent at 04/24/2018  8:12 AM EDT ----- Please call patient.  Stomach ulcer test was negative.  Follow-up as planned.  Thanks, Freeman Caldron, PA-C

## 2018-05-15 MED FILL — ?AMITRIPTYLINE HCL 25 MG TA: 25 | 30 days supply | Qty: 60 | Fill #1

## 2018-05-25 ENCOUNTER — Ambulatory Visit: Payer: Self-pay | Attending: Family Medicine | Admitting: Family Medicine

## 2018-05-25 ENCOUNTER — Encounter: Payer: Self-pay | Admitting: Family Medicine

## 2018-05-25 ENCOUNTER — Ambulatory Visit (HOSPITAL_COMMUNITY)
Admission: RE | Admit: 2018-05-25 | Discharge: 2018-05-25 | Disposition: A | Discharge status: Home / Self Care | Payer: Self-pay | Source: Ambulatory Visit | Attending: Family Medicine | Admitting: Family Medicine

## 2018-05-25 VITALS — BP 130/89 | HR 102 | Resp 102 | Ht 69.0 in | Wt 225.6 lb

## 2018-05-25 DIAGNOSIS — M779 Enthesopathy, unspecified: Secondary | ICD-10-CM | POA: Insufficient documentation

## 2018-05-25 DIAGNOSIS — M25512 Pain in left shoulder: Secondary | ICD-10-CM | POA: Insufficient documentation

## 2018-05-25 DIAGNOSIS — I10 Essential (primary) hypertension: Secondary | ICD-10-CM | POA: Insufficient documentation

## 2018-05-25 DIAGNOSIS — G8929 Other chronic pain: Secondary | ICD-10-CM | POA: Insufficient documentation

## 2018-05-25 DIAGNOSIS — Z823 Family history of stroke: Secondary | ICD-10-CM | POA: Insufficient documentation

## 2018-05-25 DIAGNOSIS — K5900 Constipation, unspecified: Secondary | ICD-10-CM

## 2018-05-25 DIAGNOSIS — R1013 Epigastric pain: Secondary | ICD-10-CM

## 2018-05-25 DIAGNOSIS — R05 Cough: Secondary | ICD-10-CM

## 2018-05-25 DIAGNOSIS — M7712 Lateral epicondylitis, left elbow: Secondary | ICD-10-CM | POA: Insufficient documentation

## 2018-05-25 DIAGNOSIS — F17200 Nicotine dependence, unspecified, uncomplicated: Secondary | ICD-10-CM

## 2018-05-25 DIAGNOSIS — F419 Anxiety disorder, unspecified: Secondary | ICD-10-CM

## 2018-05-25 DIAGNOSIS — K219 Gastro-esophageal reflux disease without esophagitis: Secondary | ICD-10-CM

## 2018-05-25 DIAGNOSIS — R200 Anesthesia of skin: Secondary | ICD-10-CM | POA: Insufficient documentation

## 2018-05-25 DIAGNOSIS — R058 Other specified cough: Secondary | ICD-10-CM

## 2018-05-25 DIAGNOSIS — F17210 Nicotine dependence, cigarettes, uncomplicated: Secondary | ICD-10-CM | POA: Insufficient documentation

## 2018-05-25 DIAGNOSIS — J309 Allergic rhinitis, unspecified: Secondary | ICD-10-CM

## 2018-05-25 DIAGNOSIS — Z8249 Family history of ischemic heart disease and other diseases of the circulatory system: Secondary | ICD-10-CM | POA: Insufficient documentation

## 2018-05-25 DIAGNOSIS — M5031 Other cervical disc degeneration,  high cervical region: Secondary | ICD-10-CM | POA: Insufficient documentation

## 2018-05-25 MED ORDER — CETIRIZINE HCL 10 MG PO TABS: 10.0000 mg | ORAL_TABLET | Freq: Every day | ORAL | 11 refills | Status: DC

## 2018-05-25 MED ORDER — POLYETHYLENE GLYCOL 3350 17 GM/SCOOP PO POWD: 17.0000 g | Freq: Two times a day (BID) | ORAL | 11 refills | Status: DC | PRN

## 2018-05-25 MED ORDER — PANTOPRAZOLE SODIUM 40 MG PO TBEC: 40.0000 mg | DELAYED_RELEASE_TABLET | Freq: Two times a day (BID) | ORAL | 3 refills | Status: DC

## 2018-05-25 MED ORDER — PREDNISONE 20 MG PO TABS: ORAL_TABLET | ORAL | 0 refills | Status: DC

## 2018-05-25 MED ORDER — DOXYCYCLINE HYCLATE 100 MG PO TABS: 100.0000 mg | ORAL_TABLET | Freq: Two times a day (BID) | ORAL | 0 refills | Status: DC

## 2018-05-25 MED FILL — DOXYCYCLINE HYCLATE 100 MG: 100 | 10 days supply | Qty: 20 | Fill #0

## 2018-05-25 MED FILL — ?CETIRIZINE HCL 10 MG TABLE: 10 | 30 days supply | Qty: 30 | Fill #0

## 2018-05-25 MED FILL — POLYETHYLENE GLYCOL 3350 PO: 7 days supply | Qty: 238 | Fill #0

## 2018-05-25 MED FILL — predniSONE 20 MG TABS: 20 | 5 days supply | Qty: 5 | Fill #0

## 2018-05-25 MED FILL — ?PANTOPRAZOLE SOD DR 40MG T: 40 | 15 days supply | Qty: 30 | Fill #0

## 2018-05-25 NOTE — Progress Notes (Signed)
Subjective:    Patient ID: Jason Fritz, male    DOB: 1961/07/05, 57 y.o.   MRN: [id number]  HPI 57 year old male seen in follow-up of GERD/epigastric pain, recurrent left arm numbness/left shoulder pain and patient with complaint of nasal congestion as well as 3 to 4 days of a cough that has been productive of white sputum.  Patient reports when he lies down at night he cannot breathe through his nose patient reports that he does continue to smoke but would like to stop smoking.  Patient continues to have episodes of numbness in his left arm and hand.  Patient states that sometimes it feels as if his left forearm and hand go to sleep. (At his last visit, patient was sent to the emergency department secondary to complaint of chest pain and left arm numbness and patient had negative cardiac work-up.)  Patient has increased pain in his arm/shoulder with trying to lift objects.  Patient states that this has been going on for more than a year.  Patient admits to anxiety.  Patient states that he is anxious about his health as well as about financial stressors as patient states that he had to quit his last job as he was unable to perform the job duties due to his left arm numbness and patient states that he also had onset of some back pain with prolonged standing and bending.  Patient reports that he has filed for disability but had difficulty filling out the paperwork but that he went to a lawyers office and they helped with the paperwork completion.        Patient states that he was concerned when he heard reports that over-the-counter Zantac was pulled from the shelves due to the presence of a possible carcinogen and patient stopped taking the Zantac that he was prescribed.  Patient continues to have reflux symptoms with burping/belching and backwash of fluid.  Patient would like to be placed back on pantoprazole which he took in the past.  Patient denies any abdominal pain and no blood in the stools.   Patient does report chronic issues with constipation.  Patient has had past surgery secondary to diverticulitis.  Patient would like something to help with the constipation.  Patient states that he does take stool softeners.  Past Medical History:  Diagnosis Date  . GERD (gastroesophageal reflux disease)   . Hypertension    Past Surgical History:  Procedure Laterality Date  . COLON SURGERY     had complicated diverticulitis 2005   Family History  Problem Relation Age of Onset  . Stroke Mother   . Hypertension Mother   . Stroke Father   . Diabetes Father    Social History   Tobacco Use  . Smoking status: Current Every Day Smoker    Packs/day: 0.25    Types: Cigarettes  . Smokeless tobacco: Former Systems developer  . Tobacco comment: 3 cigs/day  Substance Use Topics  . Alcohol use: Yes    Comment: occasionally  . Drug use: No  No Known Allergies    Review of Systems  Constitutional: Positive for fatigue. Negative for chills and fever.  HENT: Positive for congestion and rhinorrhea. Negative for ear pain, nosebleeds, postnasal drip, sinus pressure, sinus pain, sore throat and trouble swallowing.   Respiratory: Positive for cough. Negative for shortness of breath.   Cardiovascular: Negative for chest pain, palpitations and leg swelling.  Gastrointestinal: Positive for abdominal pain and constipation. Negative for blood in stool, diarrhea and nausea.  Endocrine: Negative for polydipsia, polyphagia and polyuria.  Genitourinary: Positive for dysuria. Negative for flank pain and frequency.  Musculoskeletal: Positive for arthralgias. Negative for back pain, gait problem and joint swelling.  Neurological: Positive for numbness. Negative for dizziness and headaches.  Psychiatric/Behavioral: Negative for self-injury, sleep disturbance and suicidal ideas. The patient is nervous/anxious.        Objective:   Physical Exam BP 130/89 (BP Location: Right Arm, Patient Position: Sitting, Cuff Size:  Large)   Pulse (!) 102   Resp (!) 102   Ht 5\' 9"  (1.753 m)   Wt 225 lb 9.6 oz (102.3 kg)   BMI 33.32 kg/m Vital signs and nurse's notes reviewed General-well-nourished, well-developed overweight male in no acute distress but patient appears anxious ENT-TMs dull, nares with moderate edema of the nasal turbinates which are pale with mild clear discharge, patient with posterior pharynx/tonsillar arch erythema and patient with a narrowed posterior airway secondary to body habitus and large tongue base Neck-supple, no lymphadenopathy, no thyromegaly, no LAD Lungs-clear to auscultation bilaterally Cardiovascular-regular rate and rhythm Musculoskeletal- patient with some mild tenderness at the left lateral anterior and posterior shoulder as well as AC joint.  Patient with tenderness at the lateral epicondyle of the left elbow.  Negative empty can/impingement sign at the left shoulder/right shoulder.  Patient did have some mild tenderness with palpation over the central cervical spine at about C4-C6. Abdomen- mild truncal obesity, soft, patient with mild epigastric discomfort to palpation as well as some mild mid to left lower quadrant lower abdominal discomfort. (Patient states that this is in the area of prior surgery for diverticulitis.  But patient does not feel as if he is having diverticulitis at this time) Extremities- patient with mild nonpitting distal lower extremity edema just above the ankles Psych- patient is very anxious at today's visit particularly repeating concerns about his inability to work as well as concern over the recurrent numbness in the left arm.       Assessment & Plan:  1. Epigastric pain Patient with continued epigastric pain and patient will be placed on pantoprazole 40 mg twice daily.  Patient discontinued use of Zantac due to concern as OTC version had been recalled. - pantoprazole (PROTONIX) 40 MG tablet; Take 1 tablet (40 mg total) by mouth 2 (two) times daily. To  reduce stomach acid  Dispense: 30 tablet; Refill: 3  2. Gastroesophageal reflux disease, esophagitis presence not specified Patient had previously been on Zantac but had concern because this medication has been recalled OTC.  Patient will be placed back on pantoprazole 40 mg twice daily for the next 2 to 3 months and then if patient is asymptomatic this can be decreased to once per day and eventually discontinued depending on symptoms.  Patient is to avoid late night eating and avoid known trigger foods 3. Left arm numbness Patient with complaint of recurrent issues with left arm numbness.  Patient also has some mild tenderness over the cervical spine.  Patient also has evidence of some left elbow lateral epicondylitis.  Patient will be placed on a short course of prednisone to help with these issues.  Order placed for cervical spine film to see if a pinched nerve/cervical degenerative disc disease may be responsible for his arm numbness - DG Cervical Spine Complete; Future - predniSONE (DELTASONE) 20 MG tablet; 2 pills today then 1 pill x 2 days then 1/2 pill for 2 days; take after eating  Dispense: 5 tablet; Refill: 0  4. Chronic left  shoulder pain Patient with complaint of chronic issues with left upper arm/shoulder pain as well as radiation of pain/numbness in the left arm.  X-ray of left shoulder ordered - DG Shoulder Left; Future  5. Allergic rhinitis, unspecified seasonality, unspecified trigger Patient with complaint of allergic rhinitis and patient request medication as he states that he often cannot breathe through his nose at night.  Prescription provided for Zyrtec for patient to take at bedtime. - cetirizine (ZYRTEC) 10 MG tablet; Take 1 tablet (10 mg total) by mouth daily. At bedtime as needed for nasal congestion  Dispense: 30 tablet; Refill: 11  6. Productive cough Patient with complaint of a few days of a productive cough with white sputum.  Patient with long history of smoking and  may have COPD.  Patient provided with prescription for doxycycline to take if his symptoms worsen. - doxycycline (VIBRA-TABS) 100 MG tablet; Take 1 tablet (100 mg total) by mouth 2 (two) times daily.  Dispense: 20 tablet; Refill: 0  7. Tobacco dependence Patient reports that he is interested in smoking cessation.  Patient will schedule follow-up visit with the clinical pharmacist to discuss strategies to help patient completely stop smoking.  8. Constipation, unspecified constipation type Patient with complaint of recurrent issues with constipation.  Patient given prescription for generic MiraLAX to take once daily but may increase as needed up to twice daily.  Patient is also asked to consider referral for colonoscopy/GI referral.  Patient with past history of complicated diverticulitis requiring surgery. - polyethylene glycol powder (GLYCOLAX/MIRALAX) powder; Take 17 g by mouth 2 (two) times daily as needed.  Dispense: 3350 g; Refill: 11  9. Anxiety Patient admits that he tends to be anxious.  Patient has had no prior treatment for anxiety.  Patient agrees to have follow-up with medical social worker.  *Patient was offered influenza immunization which he declined at today's visit  An After Visit Summary was printed and given to the patient.  Return in about 2 weeks (around 06/08/2018) for 2-3 week with PCP; 2-3 with Luke-tobacco use.

## 2018-05-27 ENCOUNTER — Telehealth: Payer: Self-pay | Admitting: *Deleted

## 2018-05-27 NOTE — Telephone Encounter (Signed)
Pt name and DOB verified. Patient aware of results and result note per Dr. Chapman Fitch. He states he has some tingling and continued discomfort. He is currently taking prednisone and wanting to know other medications you would prescribe.    Notes recorded by Antony Blackbird, MD on 05/25/2018 at 5:35 PM EST Please notify patient that the x-ray of the cervical spine shows moderate degenerative disc disease and facet joint change with bony encroachment upon the neural foramina at C3-4 bilaterally. Patient's shoulder pain with arm radiation is likely secondary to cervical degenerative disc disease

## 2018-05-28 NOTE — Telephone Encounter (Signed)
Patient can take otc pain medication and please ask patient if he would like a referral to Orthopedics

## 2018-06-03 NOTE — Telephone Encounter (Signed)
Informed of OTC medication option and referral.   Please place referral for Orthopedic and GI specialist for Colonoscopy. (per pt he has a h/o  Diverticulitis).

## 2018-06-04 ENCOUNTER — Other Ambulatory Visit: Payer: Self-pay | Admitting: Family Medicine

## 2018-06-04 DIAGNOSIS — M25512 Pain in left shoulder: Secondary | ICD-10-CM

## 2018-06-04 DIAGNOSIS — Z8719 Personal history of other diseases of the digestive system: Secondary | ICD-10-CM

## 2018-06-04 DIAGNOSIS — R1013 Epigastric pain: Secondary | ICD-10-CM

## 2018-06-04 DIAGNOSIS — M5030 Other cervical disc degeneration, unspecified cervical region: Secondary | ICD-10-CM

## 2018-06-04 DIAGNOSIS — G8929 Other chronic pain: Secondary | ICD-10-CM

## 2018-06-04 NOTE — Progress Notes (Signed)
Patient ID: Jason Fritz, male   DOB: 10/14/1960, 57 y.o.   MRN: [id number]   Patient had x-ray of the cervical spine showing cervical degenerative disc disease which is likely the cause of patient's arm pain with radiation.  Patient was offered and decided that he would like to have referral to orthopedics for further evaluation and treatment.  Patient also with history of diverticulitis and patient agrees to referral to GI.

## 2018-06-04 NOTE — Telephone Encounter (Signed)
Please notify patient that referrals have been placed for orthopedics and gastroenterology

## 2018-06-09 ENCOUNTER — Ambulatory Visit: Payer: Self-pay | Attending: Family Medicine | Admitting: Pharmacist

## 2018-06-09 NOTE — Progress Notes (Deleted)
S:  Patient was reviewed by clinical pharmacist for assistance with tobacco cessation.   Tobacco Use History  Age when started using tobacco on a daily basis ***.  Type: {Nicotine:3044014::"cigarettes","cigar","pipe","electronic cigarettes","snus"}.  Number of cigarettes per day ***, brand ***.  Estimated nicotine content: *** mg per day.   Smokes first cigarette *** minutes after waking.  {Does/does not:3044014::"Does","Does not"} wake at night to smoke  Fagerstrom Score ***/10.  Triggers include {hx nicotine triggers:311123}.  Quit Attempt History   Most recent quit attempt ***.  Longest time ever been tobacco free ***.  Methods tried in the past include {CHL AMB [facility] MEDICATIONS FOR SMOKING CESSATION:20759}.   Rates IMPORTANCE of quitting tobacco on 1-10 scale of ***.  Rates READINESS of quitting tobacco on 1-10 scale of ***.  Rates CONFIDENCE of quitting tobacco on 1-10 scale of ***.  Motivators to quitting include ***; barriers include {smoking cessation barriers:18118}   A/P: Nicotine dependence: {DESC; MILD/MOD/SEVERE:15682} , *** years duration in a patient who is {excellent/good/fair/poor:19665} candidate for success b/c of ***.     Reviewed options and patient reports ***. Initiated {CHL AMB [facility] MEDICATIONS FOR SMOKING CESSATION:20759}. Treatment was reviewed with the patient, including name, instructions, goals of therapy, potential side effects, importance of adherence, and safe use.  Reviewed potential challenges and coping skills/strategies with patient. Provided information on 1 800-QUIT NOW support program and advised patient to contact me if questions/concerns arise. Patient verbalized understanding of information by repeating back.  Follow up {follow up:15908}  Karren Cobble [place] 8:17 AM 06/09/2018

## 2018-06-10 ENCOUNTER — Ambulatory Visit: Payer: Self-pay | Attending: Family Medicine | Admitting: Pharmacist

## 2018-06-10 ENCOUNTER — Encounter: Payer: Self-pay | Admitting: Pharmacist

## 2018-06-10 DIAGNOSIS — F17210 Nicotine dependence, cigarettes, uncomplicated: Secondary | ICD-10-CM | POA: Insufficient documentation

## 2018-06-10 DIAGNOSIS — Z2821 Immunization not carried out because of patient refusal: Secondary | ICD-10-CM | POA: Insufficient documentation

## 2018-06-10 DIAGNOSIS — Z716 Tobacco abuse counseling: Secondary | ICD-10-CM | POA: Insufficient documentation

## 2018-06-10 MED ORDER — NICOTINE 21 MG/24HR TD PT24: 21.0000 mg | MEDICATED_PATCH | Freq: Every day | TRANSDERMAL | 1 refills | Status: DC

## 2018-06-10 MED ORDER — NICOTINE POLACRILEX 4 MG MT LOZG: 4.0000 mg | LOZENGE | OROMUCOSAL | 0 refills | Status: DC | PRN

## 2018-06-10 MED FILL — NICOTINE 4 MG LOZENGE: 4 | 30 days supply | Qty: 72 | Fill #0

## 2018-06-10 MED FILL — NICOTINE 21 MG/24HR PATCH: 21 | 28 days supply | Qty: 28 | Fill #0

## 2018-06-10 NOTE — Patient Instructions (Signed)
Use the nicotine lozenge as the urge to smoke occurs. [place] 1 lozenge in your cheek. You can use this every 2 hours as needed. Do not use more than 20 lozenges a day.   Start the 21 mg per day patch. Use a new patch and remove the old one every day.

## 2018-06-10 NOTE — Progress Notes (Signed)
S:  Patient was reviewed by clinical pharmacist for assistance with tobacco cessation.   Tobacco Use History  Age when started using tobacco on a daily basis 57 YO.  Type: cigarettes.  Number of cigarettes per day ~1 pack (11-20 cigarettes/day), brand Marlbaro menthol.  Estimated nicotine content:  ~1.0 mg per cigarette, ~20 mg per day.   Smokes first cigarette <5 minutes after waking.  Does wake at night to smoke  Fagerstrom Score 8/10.  Triggers include emotional: anxiety.  Quit Attempt History   Most recent quit attempt: 04/2018.  Longest time ever been tobacco free 17 years.  Methods tried in the past include Bupropion (Zyban).   Rates IMPORTANCE of quitting tobacco on 1-10 scale of 10.  Rates READINESS of quitting tobacco on 1-10 scale of 10.  Rates CONFIDENCE of quitting tobacco on 1-10 scale of 7.  Motivators to quitting include overall health; barriers include under a lot of stress now   A/P: Nicotine dependence: high, 40 years duration in a patient who is fair candidate for success b/c of readiness and self-importance. However, he does have a longer history and has failed pharmacological methods.     Reviewed options and patient reports interest in NRT. Initiated Nicotine Patch with lozenge prn for craving. With his dependence, will start with 21 mg/day of the patch. He will use combination therapy with lozenges for breakthrough. He smokes <5 mins upon awakening in the morning so he can try the 4 mg lozenge. Treatment was reviewed with the patient, including name, instructions, goals of therapy, potential side effects, importance of adherence, and safe use.  HM: PNA and tetanus vaccination indicated; wishes to cover at later encounter. Counseled on benefits.  Reviewed potential challenges and coping skills/strategies with patient. Provided information on 1 800-QUIT NOW support program and advised patient to contact me if questions/concerns arise. Patient  verbalized understanding of information by repeating back.  Follow up in 6 week(s).   Jason Fritz 10:53 AM 06/10/2018

## 2018-06-17 MED FILL — ?CETIRIZINE HCL 10 MG TABLE: 10 | 30 days supply | Qty: 30 | Fill #1

## 2018-06-17 MED FILL — ?AMITRIPTYLINE HCL 25 MG TA: 25 | 30 days supply | Qty: 60 | Fill #2

## 2018-06-17 MED FILL — ?PANTOPRAZOLE SO DR 40MG TA: 40 | 15 days supply | Qty: 30 | Fill #1

## 2018-06-17 MED FILL — POLYETHYLENE GLYCOL 3350 PO: 7 days supply | Qty: 238 | Fill #1

## 2018-06-18 ENCOUNTER — Ambulatory Visit (HOSPITAL_COMMUNITY)
Admission: RE | Admit: 2018-06-18 | Discharge: 2018-06-18 | Disposition: A | Discharge status: Home / Self Care | Payer: Self-pay | Source: Ambulatory Visit | Attending: Surgery | Admitting: Surgery

## 2018-06-18 ENCOUNTER — Ambulatory Visit (INDEPENDENT_AMBULATORY_CARE_PROVIDER_SITE_OTHER): Payer: Self-pay | Admitting: Surgery

## 2018-06-18 DIAGNOSIS — M50223 Other cervical disc displacement at C6-C7 level: Secondary | ICD-10-CM | POA: Insufficient documentation

## 2018-06-18 DIAGNOSIS — M5030 Other cervical disc degeneration, unspecified cervical region: Secondary | ICD-10-CM | POA: Insufficient documentation

## 2018-06-18 DIAGNOSIS — R2681 Unsteadiness on feet: Secondary | ICD-10-CM

## 2018-06-18 DIAGNOSIS — M25512 Pain in left shoulder: Secondary | ICD-10-CM

## 2018-06-18 DIAGNOSIS — M4722 Other spondylosis with radiculopathy, cervical region: Secondary | ICD-10-CM

## 2018-06-18 DIAGNOSIS — M4802 Spinal stenosis, cervical region: Secondary | ICD-10-CM | POA: Insufficient documentation

## 2018-06-18 DIAGNOSIS — G8929 Other chronic pain: Secondary | ICD-10-CM

## 2018-06-18 NOTE — Progress Notes (Signed)
Office Visit Note   Patient: Jason Fritz           Date of Birth: 1960-10-31           MRN: [id number] Visit Date: 06/18/2018              Requested by: Antony Blackbird, MD [address], [place] [address] PCP: Antony Blackbird, MD   Assessment & Plan: Visit Diagnoses:  1. Other spondylosis with radiculopathy, cervical region   2. Other cervical disc degeneration, unspecified cervical region   3. Unsteady gait   4. Chronic left shoulder pain     Plan: With patient's worsening neck pain and left upper extremity radiculopathy with a question of unsteady gait and failed conservative treatment up to this point I recommend getting a cervical spine MRI to rule out cervical radiculopathy versus myelopathy.  Scan is scheduled for this evening.  Will need return office visit with one of our spine surgeons to review results.  Follow-Up Instructions: Return in about 2 weeks (around 07/02/2018) for dr yates to review results.   Orders:  Orders Placed This Encounter  Procedures  . MR Cervical Spine w/o contrast   No orders of the defined types were placed in this encounter.     Procedures: No procedures performed   Clinical Data: No additional findings.   Subjective: Chief Complaint  Patient presents with  . Neck - Pain    HPI 57 year old white male comes into with complaints of neck pain and worsening left upper extremity radiculopathy.  Patient states that this problem has been worsening over the last couple months.  He has failed conservative treatment with prednisone taper, rest, activity modification.  Has been treated by his primary care physician Cammie Fulp.  She did order cervical spine x-rays which were performed on May 25, 2018 and report showed:  CLINICAL DATA:  Sharp left-sided neck and shoulder pain with numbness in the left arm and hand for the past 2 weeks. No known injury.  EXAM: CERVICAL SPINE - COMPLETE 4+ VIEW  COMPARISON:   None.  FINDINGS: The cervical vertebral bodies are preserved in height. There is moderate disc space narrowing at C5-6 and to a lesser extent at C6-7 and C3-4. There is no spondylolisthesis. There is no perched facet or spinous process fracture. The oblique views reveal mild bony encroachment upon the neural foramina especially in the upper cervical spine chiefly at the C3-4 levels bilaterally. The odontoid is intact where visualized. The prevertebral soft tissue spaces are normal.  IMPRESSION: Moderate degenerative disc and facet joint change with bony encroachment upon the neural foramina at C3-4 bilaterally. No compression fracture or listhesis.  PCP did not order a cervical MRI.      Review of Systems No current cardiac pulmonary GI GU issues  Objective: Vital Signs: There were no vitals taken for this visit.  Physical Exam  Constitutional: He appears well-developed. No distress.  HENT:  Head: Normocephalic and atraumatic.  Eyes: Pupils are equal, round, and reactive to light. EOM are normal.  Neck:  Decreased cervical spine range of motion due to pain and stiffness.  Markedly positive left brachial plexus trapezius and scapular border tenderness.  Positive Spurling test.  Left shoulder has good range of motion.  Negative drop arm test.  Mildly positive impingement test.  Right shoulder unremarkable.  Patient has trace left biceps, triceps and wrist flexion extension strength.  Question trace bilateral quad weakness.    Ortho Exam  Specialty Comments:  No specialty comments available.  Imaging: No results found.   PMFS History: Patient Active Problem List   Diagnosis Date Noted  . Unspecified constipation 04/29/2013  . Dental abscess 04/29/2013  . OTITIS MEDIA, ACUTE, RIGHT 04/02/2010  . FATIGUE 11/15/2009  . EAR PAIN, RIGHT 11/16/2008  . DENTAL DISORDER 03/22/2008  . ERECTILE DYSFUNCTION 01/21/2008  . CONSTIPATION, CHRONIC 01/21/2008  . HYPERLIPIDEMIA,  MIXED 12/04/2007  . ELEVATED BLOOD PRESSURE WITHOUT DIAGNOSIS OF HYPERTENSION 12/04/2007  . TOBACCO USER 11/22/2006  . DEPRESSION 11/22/2006  . ALLERGIC RHINITIS 11/22/2006  . GERD 11/22/2006  . DIVERTICULITIS, HX OF 11/22/2006   Past Medical History:  Diagnosis Date  . GERD (gastroesophageal reflux disease)   . Hypertension     Family History  Problem Relation Age of Onset  . Stroke Mother   . Hypertension Mother   . Stroke Father   . Diabetes Father     Past Surgical History:  Procedure Laterality Date  . COLON SURGERY     had complicated diverticulitis 2005   Social History   Occupational History  . Not on file  Tobacco Use  . Smoking status: Current Every Day Smoker    Packs/day: 1.00    Types: Cigarettes  . Smokeless tobacco: Former Systems developer  . Tobacco comment: 11-20 cigarettes a day  Substance and Sexual Activity  . Alcohol use: Yes    Comment: occasionally  . Drug use: No  . Sexual activity: Not on file

## 2018-06-19 ENCOUNTER — Other Ambulatory Visit: Payer: Self-pay | Admitting: Family Medicine

## 2018-06-19 ENCOUNTER — Encounter: Payer: Self-pay | Admitting: Licensed Clinical Social Worker

## 2018-06-19 ENCOUNTER — Ambulatory Visit: Payer: Self-pay | Attending: Family Medicine | Admitting: Licensed Clinical Social Worker

## 2018-06-19 ENCOUNTER — Encounter (INDEPENDENT_AMBULATORY_CARE_PROVIDER_SITE_OTHER): Payer: Self-pay | Admitting: Surgery

## 2018-06-19 VITALS — BP 127/92 | HR 82 | Temp 98.6°F | Resp 18

## 2018-06-19 DIAGNOSIS — F419 Anxiety disorder, unspecified: Secondary | ICD-10-CM | POA: Insufficient documentation

## 2018-06-19 DIAGNOSIS — F329 Major depressive disorder, single episode, unspecified: Secondary | ICD-10-CM

## 2018-06-19 DIAGNOSIS — F331 Major depressive disorder, recurrent, moderate: Secondary | ICD-10-CM

## 2018-06-19 MED ORDER — SERTRALINE HCL 50 MG PO TABS: 50.0000 mg | ORAL_TABLET | Freq: Every day | ORAL | 3 refills | Status: DC

## 2018-06-19 MED FILL — SERTRALINE HCL 50 MG TABS: 50 | 30 days supply | Qty: 30 | Fill #0

## 2018-06-19 NOTE — Progress Notes (Signed)
Patient ID: Jason Fritz, male   DOB: 1960/09/24, 57 y.o.   MRN: [id number]   Patient was seen earlier today by medical social worker.  Patient with depression related to the death of his mother.  Patient reports that his mother passed away around [place] back in 2000 but patient states that it feels as if this just occurred yesterday.  Patient states that he is an only child and his mother was his best friend.  Patient also has had recent health issues and is currently unemployed and he thinks that all of these factors have contributed to depression.  Patient states that he was on a medication in the past after his mother passed away and the medication help but he cannot recall the name of the medication.  Patient also does not remember the pharmacy at which he had the prior medication filled.  Patient denies any suicidal thoughts or ideations.  Patient does tend to spend a lot of time worrying.  Medications for depression were discussed with the patient and prescription sent to this pharmacy for Zoloft 50 mg and patient will start with half pill, 25 mg nightly for 7 days then increase to 1 whole pill, 50 mg nightly.  Also discussed with patient that he should try to delay his bedtime as he currently goes to bed at 9:00 and then wakes up around 3 AM and is then up for the day.  Patient was asked to gradually push back his bedtime so that he will hopefully sleep later at night until 6 or 7 AM.  Patient will return for reassessment in 2 weeks.  At today's visit, patient had brief exam with normal lung sounds, normal cardiac exam and normal abdominal exam with no reproducible abdominal pain.

## 2018-06-19 NOTE — BH Specialist Note (Signed)
Integrated Behavioral Health Initial Visit  MRN: [id number] Name: Jason Fritz  Number of [place] Clinician visits:: 1/6 Session Start time: 9:30 AM  Session End time: 10:00 AM Total time: 30 minutes  Type of Service: [place] Interpretor:No. Interpretor Name and Language: N/A   Warm Hand Off Completed.       SUBJECTIVE: Jason Fritz is a 57 y.o. male accompanied by self Patient was referred by Dr. Chapman Fitch for depression and anxiety. Patient reports the following symptoms/concerns: Pt reports an increase in depression and anxiety triggered by grief of loved ones.  Duration of problem: Ongoing Pt's mother passed in 2000; Severity of problem: moderate  OBJECTIVE: Mood: Appropriate and Affect: Depressed Risk of harm to self or others: No plan to harm self or others  LIFE CONTEXT: Family and Social: Pt has an aunt that resides locally. He talks on the phone with friends. Pt resides alone School/Work: Pt has been approved for 100% CAFA, food stamps ($200), and has applied for disability Self-Care: Pt completes yardwork at residence and watches church programs on television Life Changes: Pt has ongoing medical conditions and is grieving the loss of loved ones  GOALS ADDRESSED: Patient will: 1. Reduce symptoms of: anxiety and depression 2. Increase knowledge and/or ability of: coping skills and healthy habits  3. Demonstrate ability to: Increase healthy adjustment to current life circumstances, Increase adequate support systems for patient/family and Begin healthy grieving over loss  INTERVENTIONS: Interventions utilized: Solution-Focused Strategies, Supportive Counseling, Psychoeducation and/or Health Education and Link to [employer]  Standardized Assessments completed: Not Needed  ASSESSMENT: Patient currently experiencing depression and anxiety triggered by ongoing medical conditions and grief of loved  ones. Pt receives limited support in the community. Denies SI/HI/AVH.    Patient may benefit from psychoeducation, psychotherapy, and medication management. LCSWA educated pt on the correlation between one's physical and mental health. The stages of grief were discussed, in addition, to healthy coping skills to decrease and/or manage symptoms. Pt is interested in medication management through PCP.  PLAN: 1. Follow up with behavioral health clinician on : Pt was encouraged to contact LCSWA if symptoms worsen or fail to improve to schedule behavioral appointments at [facility]. 2. Behavioral recommendations: LCSWA recommends that pt apply healthy coping skills discussed and comply with medication management. Pt is encouraged to schedule follow up appointment with LCSWA 3. Referral(s): [place] (In Clinic) 4. "From scale of 1-10, how likely are you to follow plan?":   Jason Chesterfield, LCSW 06/22/18 4:41 PM

## 2018-06-19 NOTE — Patient Instructions (Signed)
Return in 2 weeks to see your PCP

## 2018-07-01 ENCOUNTER — Ambulatory Visit: Payer: Self-pay | Admitting: Licensed Clinical Social Worker

## 2018-07-10 ENCOUNTER — Ambulatory Visit: Payer: Self-pay | Attending: Family Medicine | Admitting: Family Medicine

## 2018-07-10 ENCOUNTER — Encounter: Payer: Self-pay | Admitting: Family Medicine

## 2018-07-10 VITALS — BP 128/87 | HR 99 | Temp 98.5°F | Resp 18 | Ht 69.0 in | Wt 231.0 lb

## 2018-07-10 DIAGNOSIS — F329 Major depressive disorder, single episode, unspecified: Secondary | ICD-10-CM

## 2018-07-10 DIAGNOSIS — Z8249 Family history of ischemic heart disease and other diseases of the circulatory system: Secondary | ICD-10-CM | POA: Insufficient documentation

## 2018-07-10 DIAGNOSIS — F419 Anxiety disorder, unspecified: Secondary | ICD-10-CM

## 2018-07-10 DIAGNOSIS — K5909 Other constipation: Secondary | ICD-10-CM | POA: Insufficient documentation

## 2018-07-10 DIAGNOSIS — I10 Essential (primary) hypertension: Secondary | ICD-10-CM | POA: Insufficient documentation

## 2018-07-10 DIAGNOSIS — K649 Unspecified hemorrhoids: Secondary | ICD-10-CM | POA: Insufficient documentation

## 2018-07-10 DIAGNOSIS — Z8719 Personal history of other diseases of the digestive system: Secondary | ICD-10-CM

## 2018-07-10 DIAGNOSIS — K5900 Constipation, unspecified: Secondary | ICD-10-CM

## 2018-07-10 DIAGNOSIS — K219 Gastro-esophageal reflux disease without esophagitis: Secondary | ICD-10-CM

## 2018-07-10 DIAGNOSIS — R079 Chest pain, unspecified: Secondary | ICD-10-CM | POA: Insufficient documentation

## 2018-07-10 DIAGNOSIS — Z79899 Other long term (current) drug therapy: Secondary | ICD-10-CM | POA: Insufficient documentation

## 2018-07-10 DIAGNOSIS — F4321 Adjustment disorder with depressed mood: Secondary | ICD-10-CM | POA: Insufficient documentation

## 2018-07-10 DIAGNOSIS — F17210 Nicotine dependence, cigarettes, uncomplicated: Secondary | ICD-10-CM

## 2018-07-10 DIAGNOSIS — M5412 Radiculopathy, cervical region: Secondary | ICD-10-CM

## 2018-07-10 DIAGNOSIS — M25512 Pain in left shoulder: Secondary | ICD-10-CM | POA: Insufficient documentation

## 2018-07-10 MED ORDER — DOCUSATE SODIUM 100 MG PO CAPS: ORAL_CAPSULE | ORAL | 6 refills | Status: DC

## 2018-07-10 MED ORDER — SERTRALINE HCL 100 MG PO TABS: 100.0000 mg | ORAL_TABLET | Freq: Every day | ORAL | 3 refills | Status: DC

## 2018-07-10 MED FILL — ?PANTOPRAZOLE SO DR 40MG TA: 40 | 15 days supply | Qty: 30 | Fill #2

## 2018-07-10 MED FILL — SERTRALINE HCL 100 MG TAB: 100 | 30 days supply | Qty: 30 | Fill #0

## 2018-07-10 MED FILL — POLYETHYLENE GLYCOL 3350 PO: 7 days supply | Qty: 238 | Fill #2

## 2018-07-10 MED FILL — ?CETIRIZINE HCL 10 MG TABLE: 10 | 30 days supply | Qty: 30 | Fill #2

## 2018-07-10 NOTE — Patient Instructions (Signed)
Constipation, Adult  Constipation is when a person:  · Poops (has a bowel movement) fewer times in a week than normal.  · Has a hard time pooping.  · Has poop that is dry, hard, or bigger than normal.  Follow these instructions at home:  Eating and drinking    · Eat foods that have a lot of fiber, such as:  ? Fresh fruits and vegetables.  ? Whole grains.  ? Beans.  · Eat less of foods that are high in fat, low in fiber, or overly processed, such as:  ? French fries.  ? Hamburgers.  ? Cookies.  ? Candy.  ? Soda.  · Drink enough fluid to keep your pee (urine) clear or pale yellow.  General instructions  · Exercise regularly or as told by your doctor.  · Go to the restroom when you feel like you need to poop. Do not hold it in.  · Take over-the-counter and prescription medicines only as told by your doctor. These include any fiber supplements.  · Do pelvic floor retraining exercises, such as:  ? Doing deep breathing while relaxing your lower belly (abdomen).  ? Relaxing your pelvic floor while pooping.  · Watch your condition for any changes.  · Keep all follow-up visits as told by your doctor. This is important.  Contact a doctor if:  · You have pain that gets worse.  · You have a fever.  · You have not pooped for 4 days.  · You throw up (vomit).  · You are not hungry.  · You lose weight.  · You are bleeding from the anus.  · You have thin, pencil-like poop (stool).  Get help right away if:  · You have a fever, and your symptoms suddenly get worse.  · You leak poop or have blood in your poop.  · Your belly feels hard or bigger than normal (is bloated).  · You have very bad belly pain.  · You feel dizzy or you faint.  This information is not intended to replace advice given to you by your health care provider. Make sure you discuss any questions you have with your health care provider.  Document Released: 12/18/2007 Document Revised: 01/19/2016 Document Reviewed: 12/20/2015  Elsevier Interactive Patient Education ©  2019 [employer]    Hemorrhoids  Hemorrhoids are swollen veins that may develop:  · In the butt (rectum). These are called internal hemorrhoids.  · Around the opening of the butt (anus). These are called external hemorrhoids.  Hemorrhoids can cause pain, itching, or bleeding. Most of the time, they do not cause serious problems. They usually get better with diet changes, lifestyle changes, and other home treatments.  What are the causes?  This condition may be caused by:  · Having trouble pooping (constipation).  · Pushing hard (straining) to poop.  · Watery poop (diarrhea).  · Pregnancy.  · Being very overweight (obese).  · Sitting for long periods of time.  · Heavy lifting or other activity that causes you to strain.  · Anal sex.  · Riding a bike for a long period of time.  What are the signs or symptoms?  Symptoms of this condition include:  · Pain.  · Itching or soreness in the butt.  · Bleeding from the butt.  · Leaking poop.  · Swelling in the area.  · One or more lumps around the opening of your butt.  How is this diagnosed?  A doctor can often diagnose   this condition by looking at the affected area. The doctor may also:  · Do an exam that involves feeling the area with a gloved hand (digital rectal exam).  · Examine the area inside your butt using a small tube (anoscope).  · Order blood tests. This may be done if you have lost a lot of blood.  · Have you get a test that involves looking inside the colon using a flexible tube with a camera on the end (sigmoidoscopy or colonoscopy).  How is this treated?  This condition can usually be treated at home. Your doctor may tell you to change what you eat, make lifestyle changes, or try home treatments. If these do not help, procedures can be done to remove the hemorrhoids or make them smaller. These may involve:  · Placing rubber bands at the base of the hemorrhoids to cut off their blood supply.  · Injecting medicine into the hemorrhoids to shrink  them.  · Shining a type of light energy onto the hemorrhoids to cause them to fall off.  · Doing surgery to remove the hemorrhoids or cut off their blood supply.  Follow these instructions at home:  Eating and drinking    · Eat foods that have a lot of fiber in them. These include whole grains, beans, nuts, fruits, and vegetables.  · Ask your doctor about taking products that have added fiber (fibersupplements).  · Reduce the amount of fat in your diet. You can do this by:  ? Eating low-fat dairy products.  ? Eating less red meat.  ? Avoiding processed foods.  · Drink enough fluid to keep your pee (urine) pale yellow.  Managing pain and swelling    · Take a warm-water bath (sitz bath) for 20 minutes to ease pain. Do this 3-4 times a day. You may do this in a bathtub or using a portable sitz bath that fits over the toilet.  · If told, put ice on the painful area. It may be helpful to use ice between your warm baths.  ? Put ice in a plastic bag.  ? Place a towel between your skin and the bag.  ? Leave the ice on for 20 minutes, 2-3 times a day.  General instructions  · Take over-the-counter and prescription medicines only as told by your doctor.  ? Medicated creams and medicines may be used as told.  · Exercise often. Ask your doctor how much and what kind of exercise is best for you.  · Go to the bathroom when you have the urge to poop. Do not wait.  · Avoid pushing too hard when you poop.  · Keep your butt dry and clean. Use wet toilet paper or moist towelettes after pooping.  · Do not sit on the toilet for a long time.  · Keep all follow-up visits as told by your doctor. This is important.  Contact a doctor if you:  · Have pain and swelling that do not get better with treatment or medicine.  · Have trouble pooping.  · Cannot poop.  · Have pain or swelling outside the area of the hemorrhoids.  Get help right away if you have:  · Bleeding that will not stop.  Summary  · Hemorrhoids are swollen veins in the butt or  around the opening of the butt.  · They can cause pain, itching, or bleeding.  · Eat foods that have a lot of fiber in them. These include whole grains, beans, nuts, fruits,

## 2018-07-10 NOTE — Progress Notes (Signed)
Subjective:    Patient ID: Jason Fritz, male    DOB: [birth year], 57 y.o.   MRN: [id number]  HPI       57 yo male seen in follow-up of GERD and recent new start of medication for treatment of anxiety.  Patient states that he feels much better since starting the Zoloft.  Patient states that he feels less anxious and believes that he is worrying less.  Patient continues however to go to bed between 8:30 and 9:00 pm and wakes up at 3 or 4 in the morning and cannot go back to sleep.  Patient believes that the new medication for acid reflux is helping but he still has occasional sensation of backwash of bad tasting liquid into his mouth and throat.  Patient reports that he continues to have issues with constipation.  Patient states that he has had to take 2 scoops of the MiraLAX often in order to help have a bowel movement and patient reports that his bowels are hard.  Patient reports a past history of diverticulitis for which he had to have part of his colon removed.  Patient states that his mother also had diverticulitis.  Patient cannot recall for how long he believes that his constipation has been worse.  Patient denies any black stools.  Patient denies any current abdominal pain.  No fever or chills.  Patient states that he has had some blood with wiping after he has had hard stools or had to strain to have a bowel movement and patient occasionally has some pain with defecation.  Patient states that he was told in the past that he has hemorrhoids and patient has used an over-the-counter hemorrhoidal cream.  Patient states that he has not seen any blood with wiping in about 1-1/2 to 2 weeks.      Patient still with some posterior neck, upper back and left arm/shoulder discomfort/pain.  Patient has had no severe chest pain.  Patient has not felt as if his arm has fallen asleep.  Patient states that he is not sure when he is supposed to follow-up with orthopedics regarding his cervical radiculopathy.   Patient states that he was not given a follow-up appointment at the end of his last visit and he has not been called regarding an appointment though he was told that he would be contacted.  Past Medical History:  Diagnosis Date  . GERD (gastroesophageal reflux disease)   . Hypertension    Past Surgical History:  Procedure Laterality Date  . COLON SURGERY     had complicated diverticulitis 2005   Family History  Problem Relation Age of Onset  . Stroke Mother   . Hypertension Mother   . Stroke Father   . Diabetes Father    Social History   Tobacco Use  . Smoking status: Current Every Day Smoker    Packs/day: 1.00    Types: Cigarettes  . Smokeless tobacco: Former Systems developer  . Tobacco comment: 11-20 cigarettes a day  Substance Use Topics  . Alcohol use: Yes    Comment: occasionally  . Drug use: No  No Known Allergies    Review of Systems  Constitutional: Positive for fatigue. Negative for chills and fever.  HENT: Negative for sore throat and trouble swallowing.   Respiratory: Negative for cough and shortness of breath.   Cardiovascular: Negative for chest pain, palpitations and leg swelling.  Gastrointestinal: Positive for abdominal distention, anal bleeding, constipation and rectal pain. Negative for abdominal pain, blood in  stool, diarrhea, nausea and vomiting.  Endocrine: Negative for polydipsia, polyphagia and polyuria.  Genitourinary: Negative for dysuria and frequency.  Musculoskeletal: Positive for back pain, gait problem and neck pain. Negative for arthralgias and neck stiffness.  Neurological: Negative for dizziness and headaches.  Hematological: Negative for adenopathy. Does not bruise/bleed easily.  Psychiatric/Behavioral: Positive for sleep disturbance (awkens early but has not really pushed back his bedtime). Negative for self-injury and suicidal ideas. The patient is nervous/anxious (improved).        Objective:   Physical Exam BP 128/87 (BP Location: Left Arm,  Patient Position: Sitting, Cuff Size: Large)   Pulse 99   Temp 98.5 F (36.9 C) (Oral)   Resp 18   Ht 5\' 9"  (1.753 m)   Wt 231 lb (104.8 kg)   SpO2 98%   BMI 34.11 kg/m Nurse's notes and vital signs reviewed General-well-nourished, well-developed older male in no acute distress Lungs-clear to auscultation bilaterally Cardiovascular-regular rate and rhythm Abdomen- patient with truncal obesity versus mild distention, patient does not have any reproducible abdominal discomfort with palpation Back-no CVA tenderness Extremities-no edema Psych- patient has a somewhat flattened affect which is somewhat normal for the patient.  Patient however does interact appropriately and is talkative at today's visit.        Assessment & Plan:  1. Constipation, unspecified constipation type Patient with complaint of now chronic constipation.  Patient also has a history of diverticulitis.  Patient is encouraged to continue to increase water and increase fiber intake through fresh fruits and vegetables.  Handout provided on constipation as part of AVS as well as educational information on hemorrhoids.  Patient is being referred to gastroenterology in follow-up of his chronic constipation, history of diverticulitis, history of hemorrhoids and GERD.  Patient prescribed stool softener which he says has helped in the past.  Encourage patient as well to take MiraLAX twice daily for the next 3 to 4 days until his bowel movements occur daily and then he may decrease back to once per day.  Patient should seek medical attention for any abdominal pain, black stools blood in the stools or any concerns. - docusate sodium (COLACE) 100 MG capsule; Take 2 pills at bedtime to soften stools  Dispense: 60 capsule; Refill: 6 - Ambulatory referral to Gastroenterology  2. Gastroesophageal reflux disease, esophagitis presence not specified Patient will continue the use of pantoprazole 40 mg twice daily as well as avoidance of  known trigger foods and avoidance of late night eating.  Patient is being referred to gastroenterology for further evaluation and treatment - Ambulatory referral to Gastroenterology  3. History of diverticulitis of colon Patient with history of recurrent diverticulitis and has had to have past surgery secondary to severe diverticulitis.  Patient given prescription for Colace and is to continue the use of MiraLAX.  Patient has been referred to gastroenterology for further evaluation and treatment.  Patient denies any current or recent abdominal pain at today's visit. - docusate sodium (COLACE) 100 MG capsule; Take 2 pills at bedtime to soften stools  Dispense: 60 capsule; Refill: 6 - Ambulatory referral to Gastroenterology  4. Anxiety Patient with a history of anxiety and depression.  Patient was recently started on Zoloft to help with his anxiety which he states is associated with the death of his mother and the holiday season but patient also has had chronic anxiety issues.  Patient did receive social work consult at his last visit.  Patient is not interested in referral for counseling at this  time.  After the medication and his anxiety with the patient, patient would like to try a higher dose of the medication therefore sertraline is been increased to 100 mg once daily and patient will return in approximately 6 weeks for reevaluation of his anxiety but should return sooner if he is having any problems or concerns. - sertraline (ZOLOFT) 100 MG tablet; Take 1 tablet (100 mg total) by mouth at bedtime.  Dispense: 30 tablet; Refill: 3  5. Cervical radiculopathy Patient will contact orthopedics regarding when he needs to follow-up regarding his cervical radiculopathy.  Patient reports that he still continues to have some left-sided upper back and arm/shoulder pain and occasional mild chest pain but patient has not been so concerned about the chest pain that he is gone to the emergency department.  Prior to  diagnosis of cervical radiculopathy, patient was making frequent emergency department visits secondary to left arm numbness and chest pain.  6. Reactive depression (situational) Patient reports that he has felt better anxiety wise as well as less depression this holiday season since starting sertraline 50 mg nightly but patient does feel that he might benefit from a higher dose.  New prescription provided for sertraline 100 mg at bedtime and patient will call if he has any problems or issues with the increased dose and will otherwise follow-up in 6 weeks for reevaluation.  An After Visit Summary was printed and given to the patient. Allergies as of 07/10/2018   No Known Allergies     Medication List       Accurate as of July 10, 2018  1:54 PM. Always use your most recent med list.        amitriptyline 25 MG tablet Commonly known as:  ELAVIL 1-2 at bedtime prn insomnia   cetirizine 10 MG tablet Commonly known as:  ZYRTEC Take 1 tablet (10 mg total) by mouth daily. At bedtime as needed for nasal congestion   dicyclomine 20 MG tablet Commonly known as:  BENTYL Take 1 tablet (20 mg total) by mouth 2 (two) times daily.   docusate sodium 100 MG capsule Commonly known as:  COLACE Take 2 pills at bedtime to soften stools   ibuprofen 200 MG tablet Commonly known as:  ADVIL,MOTRIN Take 200 mg by mouth every 6 (six) hours as needed for mild pain.   nicotine 21 mg/24hr patch Commonly known as:  NICODERM CQ - dosed in mg/24 hours Place 1 patch (21 mg total) onto the skin daily.   nicotine polacrilex 4 MG lozenge Commonly known as:  NICOTINE MINI Take 1 lozenge (4 mg total) by mouth as needed for smoking cessation.   pantoprazole 40 MG tablet Commonly known as:  PROTONIX Take 1 tablet (40 mg total) by mouth 2 (two) times daily. To reduce stomach acid   polyethylene glycol powder powder Commonly known as:  GLYCOLAX/MIRALAX Take 17 g by mouth 2 (two) times daily as needed.     senna-docusate 8.6-50 MG tablet Commonly known as:  Senokot-S Take 1 tablet by mouth at bedtime. For constipation   sertraline 100 MG tablet Commonly known as:  ZOLOFT Take 1 tablet (100 mg total) by mouth at bedtime.      Return in about 6 weeks (around 08/21/2018) for change in medication. - sertraline (ZOLOFT) 100 MG tablet; Take 1 tablet (100 mg total) by mouth at bedtime.  Dispense: 30 tablet; Refill: 3

## 2018-07-13 ENCOUNTER — Other Ambulatory Visit: Payer: Self-pay | Admitting: Physician Assistant

## 2018-07-13 MED FILL — ?AMITRIPTYLINE HCL 25MG TA: 25 | 30 days supply | Qty: 60 | Fill #0

## 2018-07-22 ENCOUNTER — Ambulatory Visit: Payer: Self-pay | Attending: Family Medicine | Admitting: Pharmacist

## 2018-07-22 ENCOUNTER — Telehealth: Payer: Self-pay | Admitting: Family Medicine

## 2018-07-22 ENCOUNTER — Encounter: Payer: Self-pay | Admitting: Pharmacist

## 2018-07-22 DIAGNOSIS — Z716 Tobacco abuse counseling: Secondary | ICD-10-CM

## 2018-07-22 DIAGNOSIS — F17210 Nicotine dependence, cigarettes, uncomplicated: Secondary | ICD-10-CM | POA: Insufficient documentation

## 2018-07-22 MED ORDER — NICOTINE 14 MG/24HR TD PT24: 14.0000 mg | MEDICATED_PATCH | Freq: Every day | TRANSDERMAL | 0 refills | Status: DC

## 2018-07-22 MED FILL — NICOTINE 14 MG/24HR PATCH: 14 | 28 days supply | Qty: 28 | Fill #0

## 2018-07-22 NOTE — Patient Instructions (Signed)
Thank you for coming to see [place] today.   You are doing great with your progress. Keep it up!  We are decreasing the strength of your nicotine patch today. Use 1 patch daily for the next 2 weeks. Remove the old patch before applying the new one.   Continue the lozenge as needed for breakthrough cravings.   If you have any questions about medications, please call me [phone].  Lurena Joiner

## 2018-07-22 NOTE — Telephone Encounter (Signed)
Patient called to check on his GI referral and would like to know if he can be referred to another clinic within [facility]. Please follow up.

## 2018-07-22 NOTE — Progress Notes (Signed)
S:  Patient was reviewed by clinical pharmacist for assistance with tobacco cessation.   Tobacco Use History  Age when started using tobacco on a daily basis 58 YO.  Type: cigarettes.  Number of cigarettes per day 3-4, brand - [place] non- menthol.  Estimated nicotine content:  ~1.0 mg per cigarette, ~3-4 mg per day.   Smokes first cigarette: several hours after waking  Does wake at night to smoke  Fagerstrom Score 8/10  Triggers include emotional: anxiety, physiological (after eating).  Quit Attempt History   Most recent quit attempt: 05/2018.  Longest time ever been tobacco free 17 years.  Methods tried in the past include Bupropion (Zyban).   Rates IMPORTANCE of quitting tobacco on 1-10 scale of 10.  Rates READINESS of quitting tobacco on 1-10 scale of 10.  Rates CONFIDENCE of quitting tobacco on 1-10 scale of 8.  Motivators to quitting include overall health; barriers include under a lot of stress now   A/P: Nicotine dependence: high, 40 years duration in a patient who is a good candidate for success b/c of his current progress. Commended him on cutting down from 20 to 4-5 cigarettes a day. He is pleased with his progress and notes a decreased desire for smoking. He shares my goal of complete cessation within the next month.    Reviewed options and patient reports desire to continue to Step 2 of the patches. Decreased dose of Nicotine Patch to 14 mg/day. He can use the lozenge prn for craving. Treatment was reviewed with the patient, including name, instructions, goals of therapy, potential side effects, importance of adherence, and safe use.   HM: PNA and tetanus vaccination indicated; wishes to cover at later encounter. Counseled on benefits.  Reviewed potential challenges and coping skills/strategies with patient. Provided information on 1 800-QUIT NOW support program and advised patient to contact me if questions/concerns arise. Patient verbalized understanding of  information by repeating back.  Follow up 2 weeks.  Karren Cobble [place] 10:06 AM 07/22/2018

## 2018-07-24 NOTE — Telephone Encounter (Signed)
I spoke to patient and I told him that his referral was sent on 07/10/2018 to [facility]   PH# [phone] #2  Address   [place]

## 2018-07-28 ENCOUNTER — Ambulatory Visit (INDEPENDENT_AMBULATORY_CARE_PROVIDER_SITE_OTHER): Payer: Self-pay | Admitting: Physician Assistant

## 2018-07-28 ENCOUNTER — Encounter: Payer: Self-pay | Admitting: Physician Assistant

## 2018-07-28 VITALS — BP 140/80 | HR 90 | Ht 69.0 in | Wt 232.0 lb

## 2018-07-28 DIAGNOSIS — K625 Hemorrhage of anus and rectum: Secondary | ICD-10-CM

## 2018-07-28 DIAGNOSIS — K602 Anal fissure, unspecified: Secondary | ICD-10-CM

## 2018-07-28 DIAGNOSIS — K219 Gastro-esophageal reflux disease without esophagitis: Secondary | ICD-10-CM

## 2018-07-28 DIAGNOSIS — K6289 Other specified diseases of anus and rectum: Secondary | ICD-10-CM

## 2018-07-28 DIAGNOSIS — K5900 Constipation, unspecified: Secondary | ICD-10-CM

## 2018-07-28 MED ORDER — AMBULATORY NON FORMULARY MEDICATION: 1 refills | Status: AC

## 2018-07-28 NOTE — Patient Instructions (Signed)
You have been scheduled for a colonoscopy. Please follow written instructions given to you at your visit today.  Please pick up your prep supplies at the pharmacy within the next 1-3 days. If you use inhalers (even only as needed), please bring them with you on the day of your procedure. Your physician has requested that you go to www.startemmi.com and enter the access code given to you at your visit today. This web site gives a general overview about your procedure. However, you should still follow specific instructions given to you by our office regarding your preparation for the procedure.  We have sent a prescription for nitroglycerin 0.125% gel to [facility]. You should apply a pea size amount to your rectum three times daily x 6-8 weeks.  [facility]'s information is below: Address: [address] [place], [place] [address]  [phone]  *Please DO NOT go directly from our office to pick up this medication! Give the pharmacy 1 day to process the prescription as this is compounded at takes time to make.  Start OTC Recta Care plus Lidocaine as needed  OTC Citrucel   Continue Pantoprazole   Do Sitz bath 15-20 minutes 2 -3 times daily

## 2018-07-28 NOTE — Progress Notes (Signed)
Chief Complaint: Hemorrhoids, rectal bleeding  HPI:    [name] is a 58 year old AA male with a past medical history as listed below, known to Dr. Carlean Purl, who was referred to me by Antony Blackbird, MD for a complaint of hemorrhoids and rectal bleeding.      04/10/2018 CT abdomen pelvis with contrast with scattered colonic diverticuli without diverticulitis.  (For other findings see CT and imaging)    07/10/2018 office visit with PCP.  Discussed constipation and history of diverticulitis.  Patient was prescribed a stool softener and encouraged to take MiraLAX twice daily.  Also discussed reflux and continued on Pantoprazole 40 mg twice daily.  Discussed history of diverticulitis with previous surgery secondary to this in the past.    Today, the patient explains that over the past year or so he has been having bright red blood per rectum off and on typically after having a bowel movement.  Only sees this on the toilet paper when wiping.  Tells me at times it will clear up for a week or 2 and then come back for a week or 2.  Has tried over-the-counter suppositories may be "one a week" which seems to help some.  Also describes some rectal pain with a bowel movement.  Tells me right now he can feel an "irritation coming on".    Also describes worsened constipation over the past year or so.  Patient takes a daily stool softener and MiraLAX in the morning which helps "a little".  He complains that the MiraLAX does increase gas in his system and makes him somewhat uncomfortable.  He also feels the need to strain.    Also discusses reflux which is well controlled now on Pantoprazole 40 mg daily.    Denies fever, chills, weight loss, anorexia, nausea, vomiting or symptoms that awaken him from sleep.  Past Medical History:  Diagnosis Date  . GERD (gastroesophageal reflux disease)   . Hypertension     Past Surgical History:  Procedure Laterality Date  . COLON SURGERY     had complicated diverticulitis  2005    Current Outpatient Medications  Medication Sig Dispense Refill  . amitriptyline (ELAVIL) 25 MG tablet TAKE 1-2 TABLETS AT BEDTIME AS NEEDED FOR INSOMNIA 60 tablet 2  . cetirizine (ZYRTEC) 10 MG tablet Take 1 tablet (10 mg total) by mouth daily. At bedtime as needed for nasal congestion 30 tablet 11  . dicyclomine (BENTYL) 20 MG tablet Take 1 tablet (20 mg total) by mouth 2 (two) times daily. 20 tablet 0  . docusate sodium (COLACE) 100 MG capsule Take 2 pills at bedtime to soften stools 60 capsule 6  . ibuprofen (ADVIL,MOTRIN) 200 MG tablet Take 200 mg by mouth every 6 (six) hours as needed for mild pain.    . nicotine (NICODERM CQ - DOSED IN MG/24 HOURS) 14 mg/24hr patch Place 1 patch (14 mg total) onto the skin daily. 28 patch 0  . nicotine polacrilex (NICOTINE MINI) 4 MG lozenge Take 1 lozenge (4 mg total) by mouth as needed for smoking cessation. (Patient not taking: Reported on 07/10/2018) 72 tablet 0  . pantoprazole (PROTONIX) 40 MG tablet Take 1 tablet (40 mg total) by mouth 2 (two) times daily. To reduce stomach acid 30 tablet 3  . polyethylene glycol powder (GLYCOLAX/MIRALAX) powder Take 17 g by mouth 2 (two) times daily as needed. 3350 g 11  . senna-docusate (SENOKOT-S) 8.6-50 MG per tablet Take 1 tablet by mouth at bedtime. For constipation 30  tablet 3  . sertraline (ZOLOFT) 100 MG tablet Take 1 tablet (100 mg total) by mouth at bedtime. 30 tablet 3   Current Facility-Administered Medications  Medication Dose Route Frequency Provider Last Rate Last Dose  . aspirin chewable tablet 324 mg  324 mg Oral Once Fulp, Cammie, MD        Allergies as of 07/28/2018  . (No Known Allergies)    Family History  Problem Relation Age of Onset  . Stroke Mother   . Hypertension Mother   . Stroke Father   . Diabetes Father     Social History   Socioeconomic History  . Marital status: Single    Spouse name: Not on file  . Number of children: Not on file  . Years of education: Not  on file  . Highest education level: Not on file  Occupational History  . Not on file  Social Needs  . Financial resource strain: Not on file  . Food insecurity:    Worry: Not on file    Inability: Not on file  . Transportation needs:    Medical: Not on file    Non-medical: Not on file  Tobacco Use  . Smoking status: Current Every Day Smoker    Packs/day: 1.00    Types: Cigarettes  . Smokeless tobacco: Former Systems developer  . Tobacco comment: 11-20 cigarettes a day  Substance and Sexual Activity  . Alcohol use: Yes    Comment: occasionally  . Drug use: No  . Sexual activity: Not Currently  Lifestyle  . Physical activity:    Days per week: Not on file    Minutes per session: Not on file  . Stress: Not on file  Relationships  . Social connections:    Talks on phone: Not on file    Gets together: Not on file    Attends religious service: Not on file    Active member of club or organization: Not on file    Attends meetings of clubs or organizations: Not on file    Relationship status: Not on file  . Intimate partner violence:    Fear of current or ex partner: Not on file    Emotionally abused: Not on file    Physically abused: Not on file    Forced sexual activity: Not on file  Other Topics Concern  . Not on file  Social History Narrative  . Not on file    Review of Systems:    Constitutional: No weight loss, fever or chills Skin: No rash  Cardiovascular: No chest pain   Respiratory: No SOB  Gastrointestinal: See HPI and otherwise negative Genitourinary: No dysuria  Neurological: No headache, dizziness or syncope Musculoskeletal: No new muscle or joint pain Hematologic: No bruising Psychiatric: No history of depression or anxiety   Physical Exam:  Vital signs: BP 140/80   Pulse 90   Ht 5\' 9"  (1.753 m)   Wt 232 lb (105.2 kg)   SpO2 96%   BMI 34.26 kg/m   Constitutional:   Pleasant AA male appears to be in NAD, Well developed, Well nourished, alert and  cooperative Head:  Normocephalic and atraumatic. Eyes:   PEERL, EOMI. No icterus. Conjunctiva pink. Ears:  Normal auditory acuity. Neck:  Supple Throat: Oral cavity and pharynx without inflammation, swelling or lesion.  Respiratory: Respirations even and unlabored. Lungs clear to auscultation bilaterally.   No wheezes, crackles, or rhonchi.  Cardiovascular: Normal S1, S2. No MRG. Regular rate and rhythm. No peripheral edema,  cyanosis or pallor.  Gastrointestinal:  Soft, nondistended, nontender. No rebound or guarding. Normal bowel sounds. No appreciable masses or hepatomegaly. Rectal:  External: external hemorrhoid, ttp, fissure, rectal spasms; Internal: due to pain exam deferred Msk:  Symmetrical without gross deformities. Without edema, no deformity or joint abnormality.  Neurologic:  Alert and  oriented x4;  grossly normal neurologically.  Skin:   Dry and intact without significant lesions or rashes. Psychiatric: Demonstrates good judgement and reason without abnormal affect or behaviors.  MOST RECENT LABS AND IMAGING: CBC    Component Value Date/Time   WBC 8.1 04/10/2018 1138   RBC 4.73 04/10/2018 1138   HGB 15.2 04/10/2018 1138   HGB 14.8 03/25/2018 0954   HCT 45.3 04/10/2018 1138   HCT 42.7 03/25/2018 0954   PLT 225 04/10/2018 1138   PLT 228 03/25/2018 0954   MCV 95.8 04/10/2018 1138   MCV 90 03/25/2018 0954   MCH 32.1 04/10/2018 1138   MCHC 33.6 04/10/2018 1138   RDW 13.2 04/10/2018 1138   RDW 12.5 03/25/2018 0954   LYMPHSABS 2.3 03/25/2018 0954   MONOABS 0.5 05/30/2010 2218   EOSABS 0.3 03/25/2018 0954   BASOSABS 0.1 03/25/2018 0954    CMP     Component Value Date/Time   NA 137 04/10/2018 1138   NA 139 03/25/2018 0954   K 4.4 04/10/2018 1138   CL 107 04/10/2018 1138   CO2 21 (L) 04/10/2018 1138   GLUCOSE 114 (H) 04/10/2018 1138   BUN 17 04/10/2018 1138   BUN 18 03/25/2018 0954   CREATININE 0.99 04/10/2018 1138   CALCIUM 9.3 04/10/2018 1138   PROT 7.0  04/10/2018 1138   PROT 6.7 03/25/2018 0954   ALBUMIN 3.7 04/10/2018 1138   ALBUMIN 4.3 03/25/2018 0954   AST 18 04/10/2018 1138   ALT 22 04/10/2018 1138   ALKPHOS 50 04/10/2018 1138   BILITOT 0.7 04/10/2018 1138   BILITOT <0.2 03/25/2018 0954   GFRNONAA >60 04/10/2018 1138   GFRAA >60 04/10/2018 1138    Assessment: 1.  Anal fissure: Seen at time of exam, tender to palpation, rectal spasm 2.  Hemorrhoids: on external exam, internal unable to be completed due to pain 3.  Rectal bleeding: Likely related to hemorrhoid/fissure above, but no previous colonoscopy 4.  Constipation: Some relief with a Colace and MiraLAX daily 5.  GERD: Controlled on Pantoprazole 40 daily  Plan: 1.  Prescribed Nitroglycerin ointment to be applied twice daily to rectum with gloved finger for the next 6-8 weeks. 2.  Recommend the patient buy over-the-counter RectiCare cream with Lidocaine and apply as needed for pain 3.  Recommend sitz bath for 15-20 minutes 2-3 times a day 4.  Encouraged the patient to continue his stool softener and MiraLAX.  Discussed a fiber supplement such as Citrucel which is less gas causing. 5.  Continue Pantoprazole 40 daily for reflux 6.  Patient scheduled for colonoscopy with Dr. Carlean Purl in the [facility].  Did discuss risks, benefits, ;imitations and alternatives and the patient agrees to proceed. 7.  Patient to follow in clinic per recommendations from Dr. Carlean Purl after time of procedure.  Ellouise Newer, PA-C [facility] 07/28/2018, 8:56 AM  Cc: Antony Blackbird, MD

## 2018-08-04 MED FILL — ?CETIRIZINE HCL 10 MG TABLE: 10 | 30 days supply | Qty: 30 | Fill #3

## 2018-08-05 ENCOUNTER — Telehealth: Payer: Self-pay | Admitting: Family Medicine

## 2018-08-05 ENCOUNTER — Telehealth: Payer: Self-pay | Admitting: Internal Medicine

## 2018-08-05 ENCOUNTER — Ambulatory Visit: Payer: Self-pay | Admitting: Pharmacist

## 2018-08-05 NOTE — Telephone Encounter (Signed)
I spoke with Marcello Moores and explained that everything is over the counter and we went over the names of the products he needs. He verbalized understanding.

## 2018-08-05 NOTE — Telephone Encounter (Signed)
Pt sched for colon 2.3.20 with Dr. Carlean Purl.  Pt needs prep soln sent to [facility].

## 2018-08-06 MED FILL — POLYETHYLENE GLYCOL 3350 PO: 7 days supply | Qty: 238 | Fill #3

## 2018-08-06 MED FILL — ?AMITRIPTYLINE HCL 25MG TA: 25 | 30 days supply | Qty: 60 | Fill #1

## 2018-08-06 MED FILL — SERTRALINE HCL 100 MG TAB: 100 | 30 days supply | Qty: 30 | Fill #1

## 2018-08-17 ENCOUNTER — Ambulatory Visit (AMBULATORY_SURGERY_CENTER): Payer: Self-pay | Admitting: Internal Medicine

## 2018-08-17 ENCOUNTER — Encounter: Payer: Self-pay | Admitting: Internal Medicine

## 2018-08-17 VITALS — BP 108/81 | HR 90 | Temp 95.7°F | Resp 19 | Ht 69.0 in | Wt 232.0 lb

## 2018-08-17 DIAGNOSIS — K6289 Other specified diseases of anus and rectum: Secondary | ICD-10-CM

## 2018-08-17 DIAGNOSIS — D120 Benign neoplasm of cecum: Secondary | ICD-10-CM

## 2018-08-17 DIAGNOSIS — K625 Hemorrhage of anus and rectum: Secondary | ICD-10-CM

## 2018-08-17 DIAGNOSIS — K5730 Diverticulosis of large intestine without perforation or abscess without bleeding: Secondary | ICD-10-CM

## 2018-08-17 MED ORDER — SODIUM CHLORIDE 0.9 % IV SOLN: 500.0000 mL | Freq: Once | INTRAVENOUS | Status: AC

## 2018-08-17 NOTE — Progress Notes (Signed)
To PACU, VSS. Report to Rn.tb 

## 2018-08-17 NOTE — Patient Instructions (Addendum)
I found and removed one medium-large polyp - does not look like cancer.  The colon cleanse was not as good as necessary.  Based upon that and the size of your polyp I will recommend repeating the colonoscopy within a few months.  I appreciate the opportunity to care for you. Gatha Mayer, MD, [facility]  Polyp handout given to patient. Diverticulosis handout given to patient.  Resume previous diet. Continue present medications.  Await pathology results.  Repeat colonoscopy in 3-6 months.  Will need extra/different prep.  YOU HAD AN ENDOSCOPIC PROCEDURE TODAY AT [facility]:   Refer to the procedure report that was given to you for any specific questions about what was found during the examination.  If the procedure report does not answer your questions, please call your gastroenterologist to clarify.  If you requested that your care partner not be given the details of your procedure findings, then the procedure report has been included in a sealed envelope for you to review at your convenience later.  YOU SHOULD EXPECT: Some feelings of bloating in the abdomen. Passage of more gas than usual.  Walking can help get rid of the air that was put into your GI tract during the procedure and reduce the bloating. If you had a lower endoscopy (such as a colonoscopy or flexible sigmoidoscopy) you may notice spotting of blood in your stool or on the toilet paper. If you underwent a bowel prep for your procedure, you may not have a normal bowel movement for a few days.  Please Note:  You might notice some irritation and congestion in your nose or some drainage.  This is from the oxygen used during your procedure.  There is no need for concern and it should clear up in a day or so.  SYMPTOMS TO REPORT IMMEDIATELY:   Following lower endoscopy (colonoscopy or flexible sigmoidoscopy):  Excessive amounts of blood in the stool  Significant tenderness or worsening of abdominal  pains  Swelling of the abdomen that is new, acute  Fever of 100F or higher For urgent or emergent issues, a gastroenterologist can be reached at any hour by calling [phone].   DIET:  We do recommend a small meal at first, but then you may proceed to your regular diet.  Drink plenty of fluids but you should avoid alcoholic beverages for 24 hours.  ACTIVITY:  You should plan to take it easy for the rest of today and you should NOT DRIVE or use heavy machinery until tomorrow (because of the sedation medicines used during the test).    FOLLOW UP: Our staff will call the number listed on your records the next business day following your procedure to check on you and address any questions or concerns that you may have regarding the information given to you following your procedure. If we do not reach you, we will leave a message.  However, if you are feeling well and you are not experiencing any problems, there is no need to return our call.  We will assume that you have returned to your regular daily activities without incident.  If any biopsies were taken you will be contacted by phone or by letter within the next 1-3 weeks.  Please call us at [phone] if you have not heard about the biopsies in 3 weeks.    SIGNATURES/CONFIDENTIALITY: You and/or your care partner have signed paperwork which will be entered into your electronic medical record.  These signatures attest to  the fact that that the information above on your After Visit Summary has been reviewed and is understood.  Full responsibility of the confidentiality of this discharge information lies with you and/or your care-partner.

## 2018-08-17 NOTE — Op Note (Addendum)
[place] Patient Name: Jason Fritz Procedure Date: 08/17/2018 2:42 PM MRN: [id number] Endoscopist: Gatha Mayer , MD Age: 58 Referring MD:  Date of Birth: 1960-08-16 Gender: Male Account #: [id number] Procedure:                Colonoscopy Indications:              Rectal bleeding Medicines:                Propofol per Anesthesia, Monitored Anesthesia Care Procedure:                Pre-Anesthesia Assessment:                           - Prior to the procedure, a History and Physical                            was performed, and patient medications and                            allergies were reviewed. The patient's tolerance of                            previous anesthesia was also reviewed. The risks                            and benefits of the procedure and the sedation                            options and risks were discussed with the patient.                            All questions were answered, and informed consent                            was obtained. Prior Anticoagulants: The patient has                            taken no previous anticoagulant or antiplatelet                            agents. ASA Grade Assessment: II - A patient with                            mild systemic disease. After reviewing the risks                            and benefits, the patient was deemed in                            satisfactory condition to undergo the procedure.                           After obtaining informed consent, the colonoscope  was passed under direct vision. Throughout the                            procedure, the patient's blood pressure, pulse, and                            oxygen saturations were monitored continuously. The                            Colonoscope was introduced through the anus and                            advanced to the the cecum, identified by                            appendiceal orifice and ileocecal  valve. The                            colonoscopy was performed without difficulty. The                            patient tolerated the procedure well. The quality                            of the bowel preparation was fair. The ileocecal                            valve, appendiceal orifice, and rectum were                            photographed. The bowel preparation used was                            Miralax. Scope In: 2:50:58 PM Scope Out: 3:13:01 PM Scope Withdrawal Time: 0 hours 19 minutes 25 seconds  Total Procedure Duration: 0 hours 22 minutes 3 seconds  Findings:                 The perianal and digital rectal examinations were                            normal.                           A 15 to 20 mm polyp was found in the cecum. The                            polyp was sessile. The polyp was removed with a                            cold snare. Resection and retrieval were complete.                            Verification of patient identification for the  specimen was done. Estimated blood loss was minimal.                           Scattered diverticula were found in the left colon                            and right colon.                           The exam was otherwise without abnormality on                            direct and retroflexion views. Complications:            No immediate complications. Estimated Blood Loss:     Estimated blood loss was minimal. Impression:               - Preparation of the colon was fair.                           - One 15 to 20 mm polyp in the cecum, removed with                            a cold snare. Resected and retrieved.                           - Diverticulosis in the left colon and in the right                            colon.                           - The examination was otherwise normal on direct                            and retroflexion views. Recommendation:           - Patient has a  contact number available for                            emergencies. The signs and symptoms of potential                            delayed complications were discussed with the                            patient. Return to normal activities tomorrow.                            Written discharge instructions were provided to the                            patient.                           - Resume previous diet.                           -  Continue present medications.                           - Await pathology results.                           - Repeat colonoscopy in 3-32months. Faire porep and                            large polyp - will need different/extra prep Gatha Mayer, MD 08/17/2018 3:20:52 PM This report has been signed electronically.

## 2018-08-17 NOTE — Progress Notes (Signed)
Called to room to assist during endoscopic procedure.  Patient ID and intended procedure confirmed with present staff. Received instructions for my participation in the procedure from the performing physician.  

## 2018-08-18 ENCOUNTER — Telehealth: Payer: Self-pay

## 2018-08-18 NOTE — Telephone Encounter (Signed)
  Follow up Call-  Call back number 08/17/2018  Post procedure Call Back phone  # [phone]  Permission to leave phone message Yes  Some recent data might be hidden     Patient questions:  Do you have a fever, pain , or abdominal swelling? No. Pain Score  0 *  Have you tolerated food without any problems? Yes.    Have you been able to return to your normal activities? Yes.    Do you have any questions about your discharge instructions: Diet   No. Medications  No. Follow up visit  No.  Do you have questions or concerns about your Care? No.  Actions: * If pain score is 4 or above: No action needed, pain <4.  No problems noted per pt. maw

## 2018-08-21 ENCOUNTER — Encounter: Payer: Self-pay | Admitting: Family Medicine

## 2018-08-21 ENCOUNTER — Ambulatory Visit: Payer: Self-pay | Attending: Family Medicine | Admitting: Family Medicine

## 2018-08-21 VITALS — BP 118/87 | HR 92 | Temp 98.0°F | Resp 18 | Ht 70.5 in | Wt 228.0 lb

## 2018-08-21 DIAGNOSIS — K5900 Constipation, unspecified: Secondary | ICD-10-CM

## 2018-08-21 DIAGNOSIS — F329 Major depressive disorder, single episode, unspecified: Secondary | ICD-10-CM

## 2018-08-21 DIAGNOSIS — H9193 Unspecified hearing loss, bilateral: Secondary | ICD-10-CM

## 2018-08-21 DIAGNOSIS — F331 Major depressive disorder, recurrent, moderate: Secondary | ICD-10-CM

## 2018-08-21 DIAGNOSIS — F419 Anxiety disorder, unspecified: Secondary | ICD-10-CM

## 2018-08-21 DIAGNOSIS — J309 Allergic rhinitis, unspecified: Secondary | ICD-10-CM

## 2018-08-21 MED ORDER — SERTRALINE HCL 100 MG PO TABS: ORAL_TABLET | ORAL | 3 refills | Status: DC

## 2018-08-21 MED FILL — SERTRALINE HCL 100 MG TAB: 100 | 30 days supply | Qty: 60 | Fill #0

## 2018-08-21 NOTE — Progress Notes (Signed)
Subjective:    Patient ID: Jason Fritz, male    DOB: 06/24/1961, 58 y.o.   MRN: [id number]  HPI       58 year old male with history of anxiety and depression who is seen in follow-up of recent start of medication, sertraline to help with his symptoms and at his most recent visit, his dose was increased from 50 to 100 mg on 07/10/2018.  Patient also with complaint of constipation at his last visit and patient has had follow-up with gastroenterology.  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 Patient reports improvement in his chronic constipation. Patient is down to only 3 cigarettes per day.       Patient reports that he feels his anxiety and depression are much better controlled with sertraline 100 mg daily which was increased at his last visit however patient would like to have a further increase in dose as he believes that a higher dose may make him feel even better.  Patient reports that his sleep has improved with the use of sertraline and he has less trouble falling asleep as he is no longer worried and does not wake up as often from sleep.      Patient reports that taking the pre-colonoscopy prep seem to greatly help with his constipation as well as having removal of polyp during the procedure.  Patient states that he is having bowel movements on a daily basis.  Patient denies any blood in the stool and no dark stools.  Patient denies any pain with defecation and does not have to strain to have a bowel movement.      Patient with complaint of feeling as if he does not hear well.  Patient states that he has difficulty hearing people when he is talking on the phone and others have commented that he tends to have his TV on at a loud volume.  Patient states that he sometimes feels as if his hearing is muffled.  Patient does continue to take Zyrtec at nighttime to help with his nasal congestion and postnasal drainage and he believes that this has  helped.  Patient does tend to have issues with chronic nasal congestion.  Patient overall feels better.  Past Medical History:  Diagnosis Date  . Depression   . Diverticulosis   . GERD (gastroesophageal reflux disease)   . Hx of adenomatous polyp of colon 08/25/2018  . Hypertension    Past Surgical History:  Procedure Laterality Date  . APPENDECTOMY    . COLON SURGERY     had complicated diverticulitis 2005  . left ring finger surgery     Family History  Problem Relation Age of Onset  . Stroke Mother   . Hypertension Mother   . Stroke Father   . Diabetes Father    Social History   Tobacco Use  . Smoking status: Current Every Day Smoker    Packs/day: 1.00    Types: Cigarettes  . Smokeless tobacco: Former Systems developer  . Tobacco comment: 11-20 cigarettes a day  Substance Use Topics  . Alcohol use: Yes    Comment: occasionally  . Drug use: No  No Known Allergies     Review of Systems  Constitutional: Positive for fatigue (improved). Negative for chills, diaphoresis and fever.  HENT: Positive for congestion and hearing loss. Negative for ear pain.   Respiratory: Negative for cough and shortness of breath.   Cardiovascular: Negative for chest pain, palpitations and leg  swelling.  Gastrointestinal: Negative for abdominal pain, blood in stool, constipation, diarrhea and nausea.  Genitourinary: Negative for dysuria, flank pain, frequency and urgency.  Musculoskeletal: Positive for back pain (occasional). Negative for gait problem.  Neurological: Negative for dizziness and headaches.  Hematological: Negative for adenopathy. Does not bruise/bleed easily.  Psychiatric/Behavioral: Negative for self-injury, sleep disturbance and suicidal ideas. The patient is nervous/anxious.        Objective:   Physical Exam BP 118/87 (BP Location: Left Arm, Patient Position: Sitting, Cuff Size: Normal)   Pulse 92   Temp 98 F (36.7 C) (Oral)   Resp 18   Ht 5' 10.5" (1.791 m)   Wt 228 lb  (103.4 kg)   SpO2 97%   BMI 32.25 kg/m Nurse's notes and vital signs reviewed General-well-nourished, well-developed overweight older male in no acute distress ENT- TMs dull bilaterally, nares with moderate edema of the nasal turbinates with clear nasal discharge, patient with mild posterior pharynx erythema Neck-supple, no lymphadenopathy Lungs-clear to auscultation bilaterally Cardiovascular-regular rate and rhythm Abdomen-soft, mild distention, mild truncal obesity, nontender Back-no CVA tenderness Extremities-no edema Psych- patient with improved mood, patient is not tearful and patient with less flattened affect      Assessment & Plan:  1. Constipation, unspecified constipation type Patient reports that his constipation is greatly improved status post using medication for cleanout prior to colonoscopy as well as removal of mass/polyp during colonoscopy.  Patient will need repeat colonoscopy in a few months as patient per colonoscopy report did not have a good cleanout prior to procedure therefore colonoscopy needs to be repeated as only part of the colon could be visualized and patient did have removal of a benign polyp but this was likely obstructing/impairing his ability to defecate leading to his constipation.  2. Moderate episode of recurrent major depressive disorder [facility] Patient reports that he has had depressive type symptoms since the death of his mother a few years ago as well as anxiety.  Patient does feel that the Zoloft is helping his symptoms but he feels that he would benefit from a higher dose.  Patient will be prescribed Zoloft 100 mg 2 pills daily and patient has been asked to initially take 1-1/2 pills, 150 mg daily for the next 4 weeks and see if this dose seems sufficient if not after 4-week.,  Patient should increase to 2 whole pills once daily.  Patient reports that he is not yet interested in counseling at this time.  Patient should call or return if he has any  increased symptoms, difficulty with the medication or any concerns.  3. Bilateral hearing loss, unspecified hearing loss type Patient with complaint of bilateral hearing loss stating that others have noted that he has his TV on a loud setting and patient states that he has difficulty hearing people when he is using the phone.  Patient will be referred to ENT for further evaluation and treatment - Ambulatory referral to ENT  4. Allergic rhinitis, unspecified seasonality, unspecified trigger Patient with allergic rhinitis and this may be contributing to his issues with hearing loss due to eustachian tube dysfunction.  Patient will be referred to ENT for further evaluation and patient should continue use of nighttime cetirizine. - Ambulatory referral to ENT  5. Anxiety Patient has had significant improvement in his anxiety with the use of Zoloft.  Patient however still feels that he would benefit from a higher dose and patient's dose of Zoloft is been increased to 150 mg daily which patient should  try for the next 4 weeks but if he does not feel that the 150 mg dose is beneficial he may then increase his dose to 200 mg daily.  An After Visit Summary was printed and given to the patient.  Return in about 2 months (around 10/20/2018).

## 2018-08-24 ENCOUNTER — Ambulatory Visit: Payer: Self-pay | Attending: Family Medicine | Admitting: Pharmacist

## 2018-08-24 ENCOUNTER — Encounter: Payer: Self-pay | Admitting: Pharmacist

## 2018-08-24 DIAGNOSIS — Z716 Tobacco abuse counseling: Secondary | ICD-10-CM | POA: Insufficient documentation

## 2018-08-24 DIAGNOSIS — F17210 Nicotine dependence, cigarettes, uncomplicated: Secondary | ICD-10-CM | POA: Insufficient documentation

## 2018-08-24 NOTE — Progress Notes (Signed)
S:  Patient was reviewed by clinical pharmacist for assistance with tobacco cessation.   Tobacco Use History  Age when started using tobacco on a daily basis 58 YO.  Type: cigarettes.  Number of cigarettes per day 3, brand - [place] non- menthol  Estimated nicotine content:  ~1.0 mg per cigarette, ~3 mg per day.   Smokes first cigarette: 11 AM (uses lozenge first thing upon awakening)  Does wake at night to smoke  Fagerstrom Score 8/10  Triggers include: none currently - pt reports that he no longer smokes in response to anxiety or after eating  Quit Attempt History   Most recent quit attempt: 05/2018.  Longest time ever been tobacco free 17 years.  Methods tried in the past include Bupropion (Zyban).   Rates IMPORTANCE of quitting tobacco on 1-10 scale of 10.  Rates READINESS of quitting tobacco on 1-10 scale of 10.  Rates CONFIDENCE of quitting tobacco on 1-10 scale of 8.  Motivators to quitting include overall health; barriers include - overcoming habit   A/P: Nicotine dependence: high, 40 years duration in a patient who is a good candidate for success b/c of his current progress. Commended him on cutting down to 3 cigarettes/day. He is pleased with his progress and notes a decreased desire for smoking. He shares my goal of complete cessation within the next month. However, he does report using the 21 mg/day patch instead of the 14 mg/day one.   Reviewed options and patient reports desire to continue to Step 2 of the patches. Decreased dose of Nicotine Patch to 14 mg/day. He can use the lozenge prn for craving. Treatment was reviewed with the patient, including name, instructions, goals of therapy, potential side effects, importance of adherence, and safe use.   HM: PNA and tetanus vaccination indicated; wishes to cover at later encounter. Counseled on benefits.  Reviewed potential challenges and coping skills/strategies with patient. Provided information on 1 800-QUIT  NOW support program and advised patient to contact me if questions/concerns arise. Patient verbalized understanding of information by repeating back.  Follow up 1 month.  Karren Cobble [place] 9:23 AM 08/24/2018

## 2018-08-24 NOTE — Patient Instructions (Signed)
Thank you for coming to see [place] today.   Stop the Step 1 patch (21 mg/day). Start the 14 mg/day and use the lozenge as needed for breakthrough.

## 2018-08-25 ENCOUNTER — Encounter: Payer: Self-pay | Admitting: Internal Medicine

## 2018-08-25 DIAGNOSIS — Z86010 Personal history of colonic polyps: Secondary | ICD-10-CM

## 2018-08-25 DIAGNOSIS — Z860101 Personal history of adenomatous and serrated colon polyps: Secondary | ICD-10-CM

## 2018-08-25 HISTORY — DX: Personal history of colonic polyps: Z86.010

## 2018-08-25 HISTORY — DX: Personal history of adenomatous and serrated colon polyps: Z86.0101

## 2018-08-25 NOTE — Progress Notes (Signed)
Adenoma Recall 3 months Will need extra prep

## 2018-08-27 MED FILL — ?PANTOPRAZOLE SOD DR 40MG T: 40 | 15 days supply | Qty: 30 | Fill #3

## 2018-08-27 MED FILL — POLYETHYLENE GLYCOL 3350 PO: 17 | 7 days supply | Qty: 238 | Fill #4

## 2018-08-27 MED FILL — ?AMITRIPTYLINE HCL 25MG TA: 25 | 30 days supply | Qty: 60 | Fill #2

## 2018-08-27 MED FILL — ?CETIRIZINE HCL 10 MG TABLE: 10 | 30 days supply | Qty: 30 | Fill #4

## 2018-08-31 ENCOUNTER — Encounter: Payer: Self-pay | Admitting: Family Medicine

## 2018-09-03 ENCOUNTER — Encounter (HOSPITAL_COMMUNITY): Payer: Self-pay

## 2018-09-03 ENCOUNTER — Ambulatory Visit (HOSPITAL_COMMUNITY)
Admission: EM | Admit: 2018-09-03 | Discharge: 2018-09-03 | Disposition: A | Discharge status: Home / Self Care | Payer: Self-pay | Attending: Internal Medicine | Admitting: Internal Medicine

## 2018-09-03 DIAGNOSIS — K047 Periapical abscess without sinus: Secondary | ICD-10-CM

## 2018-09-03 MED ORDER — AMOXICILLIN-POT CLAVULANATE 875-125 MG PO TABS: 1.0000 | ORAL_TABLET | Freq: Two times a day (BID) | ORAL | 0 refills | Status: DC

## 2018-09-03 MED ORDER — KETOROLAC TROMETHAMINE 30 MG/ML IJ SOLN
INTRAMUSCULAR | Status: AC
  Filled 2018-09-03: qty 1

## 2018-09-03 MED ORDER — IBUPROFEN 600 MG PO TABS: 600.0000 mg | ORAL_TABLET | Freq: Four times a day (QID) | ORAL | 0 refills | Status: DC | PRN

## 2018-09-03 MED ORDER — KETOROLAC TROMETHAMINE 30 MG/ML IJ SOLN: 30.0000 mg | Freq: Once | INTRAMUSCULAR | Status: DC

## 2018-09-03 MED ORDER — KETOROLAC TROMETHAMINE 30 MG/ML IJ SOLN
30.0000 mg | Freq: Once | INTRAMUSCULAR | Status: AC
  Administered 2018-09-03: 30 mg via INTRAMUSCULAR

## 2018-09-03 MED FILL — AMOX-CLAV 875-125 MG TABLET: 875-125 | 7 days supply | Qty: 14 | Fill #0

## 2018-09-03 MED FILL — IBUPROFEN 600 MG TABLET: 600 | 5 days supply | Qty: 20 | Fill #0

## 2018-09-03 NOTE — ED Triage Notes (Signed)
Pt presents with dental abscess on right side.

## 2018-09-03 NOTE — ED Provider Notes (Addendum)
[place]    CSN: [id number] Arrival date & time: 09/03/18  5277     History   Chief Complaint Chief Complaint  Patient presents with  . Abscess    HPI Jason Fritz is a 58 y.o. male with a history of hypertension comes to the emergency department with complaints of dental pain of a few days duration.  Dental pain was of fairly rapid onset and is currently 10 out of 10 severity.  Pain is constant and throbbing.  No relieving factors.  No aggravating factors.  Pain is not radiating.  It is associated with a headache.  No fever or chills.  No nausea vomiting.  HPI  Past Medical History:  Diagnosis Date  . Depression   . Diverticulosis   . GERD (gastroesophageal reflux disease)   . Hx of adenomatous polyp of colon 08/25/2018  . Hypertension     Patient Active Problem List   Diagnosis Date Noted  . Hx of adenomatous polyp of colon 08/25/2018  . Unspecified constipation 04/29/2013  . Dental abscess 04/29/2013  . OTITIS MEDIA, ACUTE, RIGHT 04/02/2010  . FATIGUE 11/15/2009  . EAR PAIN, RIGHT 11/16/2008  . DENTAL DISORDER 03/22/2008  . ERECTILE DYSFUNCTION 01/21/2008  . CONSTIPATION, CHRONIC 01/21/2008  . 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 ELEVATED BLOOD PRESSURE WITHOUT DIAGNOSIS OF HYPERTENSION 12/04/2007  . TOBACCO USER 11/22/2006  . DEPRESSION 11/22/2006  . ALLERGIC RHINITIS 11/22/2006  . GERD 11/22/2006  . DIVERTICULITIS, HX OF 11/22/2006    Past Surgical History:  Procedure Laterality Date  . APPENDECTOMY    . COLON SURGERY     had complicated diverticulitis 2005  . left ring finger surgery         Home Medications    Prior to Admission medications   Medication Sig Start Date End Date Taking? Authorizing Provider  AMBULATORY NON FORMULARY MEDICATION Medication Name: Nitroglycerine ointment 0.125 %  Apply a pea sized amount internally twice times daily. Dispense 30 GM zero refill Patient not taking: Reported on 08/21/2018 07/28/18    Levin Erp, PA  amitriptyline (ELAVIL) 25 MG tablet TAKE 1-2 TABLETS AT BEDTIME AS NEEDED FOR INSOMNIA 07/13/18   Fulp, Cammie, MD  amoxicillin-clavulanate (AUGMENTIN) 875-125 MG tablet Take 1 tablet by mouth every 12 (twelve) hours. 09/03/18   Chase Picket, MD  cetirizine (ZYRTEC) 10 MG tablet Take 1 tablet (10 mg total) by mouth daily. At bedtime as needed for nasal congestion 05/25/18   Fulp, Cammie, MD  ibuprofen (ADVIL,MOTRIN) 600 MG tablet Take 1 tablet (600 mg total) by mouth every 6 (six) hours as needed for fever or mild pain. 09/03/18   , Myrene Galas, MD  nicotine (NICODERM CQ - DOSED IN MG/24 HOURS) 14 mg/24hr patch Place 1 patch (14 mg total) onto the skin daily. 07/22/18   Fulp, Cammie, MD  nicotine polacrilex (NICOTINE MINI) 4 MG lozenge Take 1 lozenge (4 mg total) by mouth as needed for smoking cessation. 06/10/18   Fulp, Cammie, MD  pantoprazole (PROTONIX) 40 MG tablet Take 1 tablet (40 mg total) by mouth 2 (two) times daily. To reduce stomach acid 05/25/18   Fulp, Cammie, MD  sertraline (ZOLOFT) 100 MG tablet Take 2 pills at bedtime 08/21/18   Antony Blackbird, MD    Family History Family History  Problem Relation Age of Onset  . Stroke Mother   . Hypertension Mother   . Stroke Father   . Diabetes Father     Social History Social History  Tobacco Use  . Smoking status: Current Every Day Smoker    Packs/day: 1.00    Types: Cigarettes  . Smokeless tobacco: Former Systems developer  . Tobacco comment: 11-20 cigarettes a day  Substance Use Topics  . Alcohol use: Yes    Comment: occasionally  . Drug use: No     Allergies   Patient has no known allergies.   Review of Systems Review of Systems  Constitutional: Positive for activity change and appetite change. Negative for chills, fatigue and fever.  HENT: Positive for dental problem, mouth sores, sinus pressure, sinus pain and sore throat. Negative for postnasal drip and rhinorrhea.   Gastrointestinal: Negative  for abdominal distention and abdominal pain.  Genitourinary: Negative for dysuria, frequency and urgency.  Musculoskeletal: Negative for arthralgias, joint swelling and myalgias.  Neurological: Negative for dizziness, weakness and light-headedness.  Psychiatric/Behavioral: Negative for agitation.     Physical Exam Triage Vital Signs ED Triage Vitals  Enc Vitals Group     BP 09/03/18 0819 (!) 147/103     Pulse Rate 09/03/18 0819 (!) 113     Resp 09/03/18 0819 18     Temp 09/03/18 0819 97.9 F (36.6 C)     Temp Source 09/03/18 0819 Oral     SpO2 09/03/18 0819 100 %     Weight --      Height --      Head Circumference --      Peak Flow --      Pain Score 09/03/18 0824 10     Pain Loc --      Pain Edu? --      Excl. in [place]? --    No data found.  Updated Vital Signs BP (!) 147/103 (BP Location: Left Arm)   Pulse (!) 113   Temp 97.9 F (36.6 C) (Oral)   Resp 18   SpO2 100%   Visual Acuity Right Eye Distance:   Left Eye Distance:   Bilateral Distance:    Right Eye Near:   Left Eye Near:    Bilateral Near:     Physical Exam HENT:     Mouth/Throat:     Mouth: Mucous membranes are moist.     Pharynx: No oropharyngeal exudate or posterior oropharyngeal erythema.  Neck:     Musculoskeletal: Normal range of motion.  Cardiovascular:     Rate and Rhythm: Normal rate and regular rhythm.     Pulses: Normal pulses.  Pulmonary:     Effort: Pulmonary effort is normal.     Breath sounds: Normal breath sounds. No wheezing or rales.  Abdominal:     General: Abdomen is flat. There is no distension.     Tenderness: There is no abdominal tenderness.  Musculoskeletal: Normal range of motion.  Skin:    General: Skin is warm.     Coloration: Skin is not jaundiced.     Findings: No erythema or rash.      UC Treatments / Results  Labs (all labs ordered are listed, but only abnormal results are displayed) Labs Reviewed - No data to display  EKG None  Radiology No  results found.  Procedures Procedures (including critical care time)  Medications Ordered in UC Medications - No data to display  Initial Impression / Assessment and Plan / UC Course  I have reviewed the triage vital signs and the nursing notes.  Pertinent labs & imaging results that were available during my care of the patient were reviewed by me and considered  in my medical decision making (see chart for details).     1.  Dental abscess: Augmentin 875 mg twice daily Ibuprofen as needed for pain  2.  Elevated blood pressure with no diagnosis of hypertension: Optimize pain management   Final Clinical Impressions(s) / UC Diagnoses   Final diagnoses:  Dental abscess   Discharge Instructions   None    ED Prescriptions    Medication Sig Dispense Auth. Provider   ibuprofen (ADVIL,MOTRIN) 600 MG tablet Take 1 tablet (600 mg total) by mouth every 6 (six) hours as needed for fever or mild pain. 20 tablet , Myrene Galas, MD   amoxicillin-clavulanate (AUGMENTIN) 875-125 MG tablet Take 1 tablet by mouth every 12 (twelve) hours. 14 tablet , Myrene Galas, MD     Controlled Substance Prescriptions [place] Controlled Substance Registry consulted? No   Chase Picket, MD 09/03/18 Filiberto Pinks, MD 09/03/18 Curly Rim

## 2018-09-22 ENCOUNTER — Ambulatory Visit: Payer: Self-pay | Admitting: Pharmacist

## 2018-09-28 ENCOUNTER — Other Ambulatory Visit: Payer: Self-pay | Admitting: Family Medicine

## 2018-09-28 MED FILL — POLYETHYLENE GLYCOL 3350 PO: 17 | 7 days supply | Qty: 238 | Fill #5

## 2018-09-28 MED FILL — ?CETIRIZINE HCL 10 MG TABLE: 10 | 90 days supply | Qty: 90 | Fill #5

## 2018-09-29 MED FILL — AMITRIPTYLINE HCL 25 MG TAB: 25 | 30 days supply | Qty: 60 | Fill #0

## 2018-10-05 ENCOUNTER — Ambulatory Visit: Payer: Self-pay

## 2018-10-19 ENCOUNTER — Ambulatory Visit: Payer: Self-pay

## 2018-10-20 ENCOUNTER — Telehealth: Payer: Self-pay | Admitting: *Deleted

## 2018-10-20 NOTE — Telephone Encounter (Signed)
Medical Assistant left message on patient's home and cell voicemail. Voicemail states to give a call back to [place] with [facility] at [phone]. Patient is aware of FU being a tele-visit on tomorrow.

## 2018-10-21 ENCOUNTER — Ambulatory Visit: Payer: Self-pay | Attending: Family Medicine | Admitting: Pharmacist

## 2018-10-21 ENCOUNTER — Ambulatory Visit: Payer: Self-pay | Admitting: Family Medicine

## 2018-10-21 ENCOUNTER — Other Ambulatory Visit: Payer: Self-pay

## 2018-10-21 ENCOUNTER — Encounter: Payer: Self-pay | Admitting: Pharmacist

## 2018-10-21 DIAGNOSIS — Z716 Tobacco abuse counseling: Secondary | ICD-10-CM

## 2018-10-21 MED ORDER — NICOTINE POLACRILEX 4 MG MT LOZG: 4.0000 mg | LOZENGE | OROMUCOSAL | 0 refills | Status: AC | PRN

## 2018-10-21 MED FILL — NICOTINE 14 MG/24HR PATCH: 14 | 14 days supply | Qty: 14 | Fill #0

## 2018-10-21 NOTE — Progress Notes (Signed)
S:  Patient was reviewed by clinical pharmacist for assistance with tobacco cessation. This encounter occurred via Telemedicine. I engaged with Jason Fritz via telephone at 8:30 AM. He is currently in his home. I am corresponding from my office. After verification using two identifiers, pt gave verbal consent to proceed with visit.   Tobacco Use History  Age when started using tobacco on a daily basis 58 YO.  Type: cigarettes.  Number of cigarettes per day: continues to smoke 3 cigarettes a day  Estimated nicotine content:  ~1.0 mg per cigarette, ~3 mg per day.   Smokes first cigarette: 11 AM   Does wake at night to smoke  Fagerstrom Score 8/10  Triggers include: staying at home   Quit Attempt History   Most recent quit attempt: 05/2018.  Longest time ever been tobacco free 17 years.  Methods tried in the past include Bupropion (Zyban).   Rates IMPORTANCE of quitting tobacco on 1-10 scale of 10.  Rates READINESS of quitting tobacco on 1-10 scale of 10.  Rates CONFIDENCE of quitting tobacco on 1-10 scale of 8.  Motivators to quitting include overall health; barriers include - overcoming habit   A/P: Nicotine dependence: high, 40 years duration in a patient who is a good candidate for success b/c of his current progress. Pt is continuing to smoke 3-4 cigarettes a day and is currently using step 2 of the patches. He is still very motivated but is having a hard time with cessation. I will keep him at Step 2 for now and reassess next month.  Reviewed options and patient reports desire to continue to Step 2 of the patches. Continued dose of Nicotine Patch to 14 mg/day. He can use the lozenge prn for craving. Treatment was reviewed with the patient, including name, instructions, goals of therapy, potential side effects, importance of adherence, and safe use.   HM: PNA and tetanus vaccination indicated; wishes to cover at later encounter. Counseled on benefits.  Reviewed potential  challenges and coping skills/strategies with patient. Provided information on 1 800-QUIT NOW support program and advised patient to contact me if questions/concerns arise. Patient verbalized understanding of information by repeating back.  Follow up 1 month.  Karren Cobble [place] 8:10 AM 10/21/2018

## 2018-10-22 ENCOUNTER — Ambulatory Visit: Payer: Self-pay | Admitting: Family Medicine

## 2018-10-22 MED FILL — SM NICOTINE 4 MG LOZENGE: 4 | 30 days supply | Qty: 72 | Fill #0

## 2018-11-06 ENCOUNTER — Other Ambulatory Visit: Payer: Self-pay

## 2018-11-06 ENCOUNTER — Ambulatory Visit: Payer: Self-pay | Attending: Family Medicine | Admitting: Family Medicine

## 2018-11-06 ENCOUNTER — Encounter: Payer: Self-pay | Admitting: Family Medicine

## 2018-11-06 DIAGNOSIS — K5909 Other constipation: Secondary | ICD-10-CM

## 2018-11-06 DIAGNOSIS — F411 Generalized anxiety disorder: Secondary | ICD-10-CM

## 2018-11-06 DIAGNOSIS — K219 Gastro-esophageal reflux disease without esophagitis: Secondary | ICD-10-CM

## 2018-11-06 DIAGNOSIS — F331 Major depressive disorder, recurrent, moderate: Secondary | ICD-10-CM

## 2018-11-06 DIAGNOSIS — K649 Unspecified hemorrhoids: Secondary | ICD-10-CM

## 2018-11-06 DIAGNOSIS — F5104 Psychophysiologic insomnia: Secondary | ICD-10-CM

## 2018-11-06 MED ORDER — AMITRIPTYLINE HCL 25 MG PO TABS: ORAL_TABLET | ORAL | 3 refills | Status: AC

## 2018-11-06 MED ORDER — PANTOPRAZOLE SODIUM 40 MG PO TBEC: 40.0000 mg | DELAYED_RELEASE_TABLET | Freq: Two times a day (BID) | ORAL | 3 refills | Status: DC

## 2018-11-06 MED ORDER — HYDROCORTISONE 2.5 % RE CREA: 1.0000 "application " | TOPICAL_CREAM | Freq: Two times a day (BID) | RECTAL | 11 refills | Status: AC

## 2018-11-06 MED ORDER — SERTRALINE HCL 100 MG PO TABS: ORAL_TABLET | ORAL | 3 refills | Status: AC

## 2018-11-06 MED ORDER — POLYETHYLENE GLYCOL 3350 17 GM/SCOOP PO POWD: ORAL | 12 refills | Status: AC

## 2018-11-06 MED FILL — POLYETHYLENE GLYCOL 3350 PO: 17 | 30 days supply | Qty: 476 | Fill #0

## 2018-11-06 MED FILL — AMITRIPTYLINE HCL 25 MG TAB: 25 | 30 days supply | Qty: 60 | Fill #0

## 2018-11-06 MED FILL — SERTRALINE HCL 100 MG TAB: 100 | 30 days supply | Qty: 60 | Fill #0

## 2018-11-06 MED FILL — ?PANTOPRAZOLE SOD DR 40MGTA: 40 | 30 days supply | Qty: 60 | Fill #0

## 2018-11-06 MED FILL — PROCTOZONE-HC 2.5 % CREA: 2.5 | 15 days supply | Qty: 30 | Fill #0

## 2018-11-06 NOTE — Progress Notes (Signed)
Med refills  Depression   Anxiety  Constipation for about 2 wks, per pt he's been taking stool med and now his stool is a bit hard

## 2018-11-06 NOTE — Progress Notes (Signed)
Virtual Visit via Telephone Note  I connected with Jason Fritz on 11/06/18 at  9:30 AM EDT by telephone and verified that I am speaking with the correct person using two identifiers.   I discussed the limitations, risks, security and privacy concerns of performing an evaluation and management service by telephone and the availability of in person appointments. I also discussed with the patient that there may be a patient responsible charge related to this service. The patient expressed understanding and agreed to proceed.  Patient Location: Home Provider Location: Office Others participating in call: Owens Loffler, CMA   History of Present Illness:      58 yo male with a history of depression, insomnia, acid reflux and chronic constipation who is being "seen" in follow-up of chronic issues.  Zoloft  continues to work well for his depression and anxiety.  Patient reports that he continues to take 2 amitriptyline at bedtime and this has improved his sleep.  He denies any suicidal thoughts or ideations.  He has had recent onset of constipation with secondary flareup of hemorrhoids.  Patient denies any blood in the stool or rectal bleeding.  Patient does have discomfort secondary to the presence of hemorrhoids.  Patient states that he has been out of MiraLAX for treatment of constipation which has likely led to his current hemorrhoids.   Patient also needs refill of pantoprazole for acid reflux as he has had more burping/belching and gas since being out of this medication.  Patient would also like medication to help with the discomfort secondary to presence of hemorrhoids.   Past Medical History:  Diagnosis Date  . Depression   . Diverticulosis   . GERD (gastroesophageal reflux disease)   . Hx of adenomatous polyp of colon 08/25/2018  . Hypertension    Past Surgical History:  Procedure Laterality Date  . APPENDECTOMY    . COLON SURGERY     had complicated diverticulitis 2005  . left  ring finger surgery     Family History  Problem Relation Age of Onset  . Stroke Mother   . Hypertension Mother   . Stroke Father   . Diabetes Father    Social History   Tobacco Use  . Smoking status: Current Every Day Smoker    Packs/day: 1.00    Types: Cigarettes  . Smokeless tobacco: Former Systems developer  . Tobacco comment: 11-20 cigarettes a day  Substance Use Topics  . Alcohol use: Yes    Comment: occasionally  . Drug use: No   No Known Allergies  Review of Systems  Constitutional: Negative for chills and fever.  HENT: Negative for congestion and sore throat.   Eyes: Negative for blurred vision and double vision.  Respiratory: Negative for cough and shortness of breath.   Cardiovascular: Negative for chest pain and palpitations.  Gastrointestinal: Positive for constipation and heartburn. Negative for abdominal pain, blood in stool, melena, nausea and vomiting.  Genitourinary: Negative for dysuria and frequency.  Skin: Negative for itching and rash.  Neurological: Negative for dizziness and headaches.  Endo/Heme/Allergies: Negative for polydipsia. Does not bruise/bleed easily.  Psychiatric/Behavioral: Negative for depression and suicidal ideas. The patient is not nervous/anxious.      Future Appointments  Date Time Provider [place]  11/25/2018  8:30 AM Daisy Blossom, Jarome Matin, RPH-CPP [facility] None    Observations/Objective: No vital signs or physical exam conducted as visit was done via telephone  Assessment and Plan: 1. CONSTIPATION, CHRONIC Patient provided with refills of Miralax which  he is encouraged to use daily for treatment of chronic constipation - polyethylene glycol powder (GLYCOLAX/MIRALAX) 17 GM/SCOOP powder; Mix 17 gm with a glass of water or juice and take once per day to treat constipation  Dispense: 3350 g; Refill: 12  2. Moderate episode of recurrent major depressive disorder (HCC)-improved and stable Patient has improvement in mood, less  depression with current dose of zoloft which he will continue - sertraline (ZOLOFT) 100 MG tablet; Take 2 pills at bedtime  Dispense: 60 tablet; Refill: 3  3. Gastroesophageal reflux disease, esophagitis presence not specified Refill provided for refill of pantoprazole for the continued treatment of GERD. Continue to avoid known trigger foods as well as avoidance of late night eating - pantoprazole (PROTONIX) 40 MG tablet; Take 1 tablet (40 mg total) by mouth 2 (two) times daily. To reduce stomach acid  Dispense: 30 tablet; Refill: 3  4. Chronic insomnia  He reports improved sleep and is currently taking 50 mg of amitriptyline which was refilled - amitriptyline (ELAVIL) 25 MG tablet; Two pills at bedtime as needed for sleep  Dispense: 180 tablet; Refill: 3  5. Hemorrhoids, unspecified hemorrhoid type RX sent to patient's pharmacy for protocream or he can obtain over the counter Preparation H. He should also resume the use of Miralax and drink water daily - hydrocortisone (ANUSOL-HC) 2.5 % rectal cream; Place 1 application rectally 2 (two) times daily. As needed for hemorrhoids  Dispense: 30 g; Refill: 11  6. Generalized anxiety disorder He reports continued improvement in mood with use of sertraline which he will continue. Refills provided - sertraline (ZOLOFT) 100 MG tablet; Take 2 pills at bedtime  Dispense: 60 tablet; Refill: 3  Follow Up Instructions:Return in about 3 months (around 02/05/2019) for chronic issues and NV BP check before next visit.    I discussed the assessment and treatment plan with the patient. The patient was provided an opportunity to ask questions and all were answered. The patient agreed with the plan and demonstrated an understanding of the instructions.   The patient was advised to call back or seek an in-person evaluation if the symptoms worsen or if the condition fails to improve as anticipated.  I provided 14 minutes of non-face-to-face time during this  encounter.   Antony Blackbird, MD

## 2018-11-24 NOTE — Progress Notes (Signed)
S:  Patient was reviewed by clinical pharmacist for assistance with tobacco cessation. This encounter occurred via Telemedicine. I engaged with Mr. Vangorder via telephone at 8:30 AM. He is currently in his home. I am corresponding from my office. After verification using two identifiers, pt gave verbal consent to proceed with visit.   Tobacco Use History  Age when started using tobacco on a daily basis 58 YO.  Type: cigarettes.  Number of cigarettes per day:  3  Estimated nicotine content:  ~3 mg/day.   Smokes first cigarette: within 30 minutes of waking  Does not wake at night to smoke   Fagerstrom Score 8/10  Triggers include: staying at home   Quit Attempt History   Most recent quit attempt: 05/2018.  Longest time ever been tobacco free 17 years.  Methods tried in the past include Bupropion (Zyban).   Rates IMPORTANCE of quitting tobacco on 1-10 scale of 10.  Rates READINESS of quitting tobacco on 1-10 scale of 10.  Rates CONFIDENCE of quitting tobacco on 1-10 scale of 10.  Motivators to quitting include health; barriers include habitual.   A/P: Nicotine dependence: high, 40 years duration in a patient who is a fair candidate for success. He is having difficulty decreasing his current cigarette count.   Reviewed options and patient reports continued interest in NRT. Treatment was reviewed with the patient, including name, instructions, goals of therapy, potential side effects, importance of adherence, and safe use.  Of note, we will space him to a July appt. After long discussion with patient, he feels empowered enough to attempt complete cessation by the time we meet for that appt. Reviewed potential challenges and coping skills/strategies with patient. Provided information on 1 800-QUIT NOW support program and advised patient to contact me if questions/concerns arise. Patient verbalized understanding of information by repeating back.  HM: PNA and tetanus vaccination  indicated; wishes to cover at later encounter. Counseled on benefits.  Follow up 01/2019. Total time spent over the phone counseling: 15 minutes.  Karren Cobble [place] 10:42 AM 11/25/2018

## 2018-11-25 ENCOUNTER — Other Ambulatory Visit: Payer: Self-pay

## 2018-11-25 ENCOUNTER — Encounter: Payer: Self-pay | Admitting: Pharmacist

## 2018-11-25 ENCOUNTER — Encounter: Payer: Self-pay | Admitting: Internal Medicine

## 2018-11-25 ENCOUNTER — Ambulatory Visit: Payer: Self-pay | Attending: Family Medicine | Admitting: Pharmacist

## 2018-11-25 DIAGNOSIS — Z716 Tobacco abuse counseling: Secondary | ICD-10-CM

## 2018-11-25 DIAGNOSIS — F17210 Nicotine dependence, cigarettes, uncomplicated: Secondary | ICD-10-CM

## 2018-12-15 ENCOUNTER — Encounter: Payer: Self-pay | Admitting: Internal Medicine

## 2018-12-29 ENCOUNTER — Other Ambulatory Visit: Payer: Self-pay

## 2019-01-12 ENCOUNTER — Encounter: Payer: Self-pay | Admitting: Internal Medicine

## 2019-01-14 ENCOUNTER — Telehealth: Payer: Self-pay | Admitting: Family Medicine

## 2019-01-14 NOTE — Telephone Encounter (Signed)
Pt was called since only received the OC application, am sending back the application with the CAFA app, also informed him that he need to submitted all document he need to get approval for CAFA and OC

## 2019-02-03 ENCOUNTER — Other Ambulatory Visit: Payer: Self-pay

## 2019-02-03 ENCOUNTER — Ambulatory Visit: Payer: Self-pay | Attending: Family Medicine | Admitting: Pharmacist

## 2019-02-03 DIAGNOSIS — Z716 Tobacco abuse counseling: Secondary | ICD-10-CM

## 2019-02-03 MED ORDER — NICOTINE 7 MG/24HR TD PT24: 7.0000 mg | MEDICATED_PATCH | Freq: Every day | TRANSDERMAL | 0 refills | Status: AC

## 2019-02-03 MED FILL — POLYETHYLENE GLYCOL 3350 PO: 17 | 30 days supply | Qty: 476 | Fill #1

## 2019-02-03 MED FILL — NICOTINE 7 MG/24HR PATCH: 7 | 28 days supply | Qty: 28 | Fill #0

## 2019-02-03 MED FILL — PROCTOZONE-HC 2.5 % CREA: 2.5 | 15 days supply | Qty: 30 | Fill #1

## 2019-02-03 MED FILL — SERTRALINE HCL 100 MG TAB: 100 | 30 days supply | Qty: 60 | Fill #1

## 2019-02-03 MED FILL — PANTOPRAZOLE SOD DR 40 MG T: 40 | 30 days supply | Qty: 60 | Fill #1

## 2019-02-03 MED FILL — AMITRIPTYLINE HCL 25 MG TAB: 25 | 30 days supply | Qty: 60 | Fill #1

## 2019-02-03 NOTE — Patient Instructions (Signed)
Thank you for coming to see [place] today.   Start back on the patch. I am reducing your dose to 7mg /day.   Continue nicotine lozenge but try to use now in place of the cigarette.   If you have any questions about medications, please call me [phone].  Lurena Joiner

## 2019-02-04 ENCOUNTER — Encounter: Payer: Self-pay | Admitting: Pharmacist

## 2019-02-04 NOTE — Progress Notes (Signed)
S:  Patient was reviewed by clinical pharmacist for assistance with tobacco cessation. This encounter occurred via Telemedicine. I engaged with Mr. Cretella via telephone at 8:30 AM. He is currently in his home. I am corresponding from my office. After verification using two identifiers, pt gave verbal consent to proceed with visit.   Tobacco Use History  Age when started using tobacco on a daily basis 58 YO.  Type: cigarettes.  Number of cigarettes per day: 3  Estimated nicotine content:  ~3 mg/day.   Smokes first cigarette: within 30 minutes of waking  Does not wake at night to smoke   Fagerstrom Score 8/10  Triggers include: staying at home   Quit Attempt History   Most recent quit attempt: 05/2018.  Longest time ever been tobacco free 17 years.  Methods tried in the past include Bupropion (Zyban).   Rates IMPORTANCE of quitting tobacco on 1-10 scale of 10.  Rates READINESS of quitting tobacco on 1-10 scale of 10.  Rates CONFIDENCE of quitting tobacco on 1-10 scale of 10.  Motivators to quitting include health; barriers include habitual.   A/P: Nicotine dependence: high, 40 years duration in a patient who is a fair candidate for success. He is having difficulty decreasing his current cigarette count.   Reviewed options and patient reports continued interest in NRT. Treatment was reviewed with the patient, including name, instructions, goals of therapy, potential side effects, importance of adherence, and safe use.  Pt continues to smoke 3 cigarettes a day but notes stopping the patch. I will have him try the 7 mg/day patch. He aims to stop smoking by [birth year] (his [birth year]). WE devised a strategy to have patient now use the lozenges in place of the cigarettes instead of both. Reviewed potential challenges and coping skills/strategies with patient. Provided information on 1 800-QUIT NOW support program and advised patient to contact me if questions/concerns arise. Patient  verbalized understanding of information by repeating back.  HM: PNA and tetanus vaccination indicated; wishes to cover at later encounter. Counseled on benefits.  Follow up 03/2019. Total time spent over the phone counseling: 15 minutes.  Benard Halsted, PharmD, [place] [phone]

## 2019-02-11 MED FILL — NICOTINE 7 MG/24HR PATCH: 7 | 28 days supply | Qty: 28 | Fill #0

## 2019-02-12 ENCOUNTER — Ambulatory Visit: Payer: Self-pay | Attending: Family Medicine

## 2019-02-12 ENCOUNTER — Other Ambulatory Visit: Payer: Self-pay

## 2019-02-12 MED FILL — ?AMITRIPTYLINE HCL 25 MG TA: 25 | 30 days supply | Qty: 60 | Fill #1

## 2019-02-12 MED FILL — ?CETIRIZINE HCL 10 MG TABLE: 10 | 90 days supply | Qty: 90 | Fill #6

## 2019-02-12 MED FILL — POLYETHYLENE GLYCOL 3350 PO: 17 | 30 days supply | Qty: 476 | Fill #1

## 2019-02-12 MED FILL — SERTRALINE HCL 100 MG TAB: 100 | 30 days supply | Qty: 60 | Fill #1

## 2019-02-12 MED FILL — ?PANTOPRAZOLE SO DR 40MG TA: 40 | 30 days supply | Qty: 60 | Fill #1

## 2019-02-18 ENCOUNTER — Ambulatory Visit: Payer: Self-pay | Attending: Family Medicine | Admitting: Family Medicine

## 2019-02-18 ENCOUNTER — Encounter: Payer: Self-pay | Admitting: Family Medicine

## 2019-02-18 ENCOUNTER — Other Ambulatory Visit: Payer: Self-pay

## 2019-02-18 DIAGNOSIS — R252 Cramp and spasm: Secondary | ICD-10-CM

## 2019-02-18 DIAGNOSIS — K5909 Other constipation: Secondary | ICD-10-CM

## 2019-02-18 DIAGNOSIS — Z860100 Personal history of colon polyps, unspecified: Secondary | ICD-10-CM

## 2019-02-18 DIAGNOSIS — Z720 Tobacco use: Secondary | ICD-10-CM

## 2019-02-18 DIAGNOSIS — R7303 Prediabetes: Secondary | ICD-10-CM

## 2019-02-18 DIAGNOSIS — F411 Generalized anxiety disorder: Secondary | ICD-10-CM

## 2019-02-18 DIAGNOSIS — Z1159 Encounter for screening for other viral diseases: Secondary | ICD-10-CM

## 2019-02-18 DIAGNOSIS — K219 Gastro-esophageal reflux disease without esophagitis: Secondary | ICD-10-CM

## 2019-02-18 DIAGNOSIS — Z79899 Other long term (current) drug therapy: Secondary | ICD-10-CM

## 2019-02-18 DIAGNOSIS — Z125 Encounter for screening for malignant neoplasm of prostate: Secondary | ICD-10-CM

## 2019-02-18 MED ORDER — METHOCARBAMOL 500 MG PO TABS: 500.0000 mg | ORAL_TABLET | Freq: Three times a day (TID) | ORAL | 3 refills | Status: AC | PRN

## 2019-02-18 MED FILL — METHOCARBAMOL 500 MG TABS: 500 | 10 days supply | Qty: 30 | Fill #0

## 2019-02-18 NOTE — Progress Notes (Signed)
Virtual Visit via Telephone Note  I connected with Jason Fritz on 02/18/19 at  2:10 PM EDT by telephone and verified that I am speaking with the correct person using two identifiers.   I discussed the limitations, risks, security and privacy concerns of performing an evaluation and management service by telephone and the availability of in person appointments. I also discussed with the patient that there may be a patient responsible charge related to this service. The patient expressed understanding and agreed to proceed.  Patient Location: Home Provider Location: Office at [place] Others participating in call: Call was initiated by Emilio Aspen, CMA then transferred to me   History of Present Illness:        58 year old male with longstanding issues with chronic anxiety who is seen in follow-up.  He reports that his anxiety is improved with the use of Zoloft.  He did have some increased anxiety due to the current COVID-19 pandemic and quarantine.  He has also been following up with clinical pharmacist regarding smoking cessation.  He feels that his reflux symptoms are controlled with the use of pantoprazole 40 mg twice daily for which he was seen by gastroenterology.  Patient was also seen for chronic constipation and had removal of a polyp which was contributing to patient's issues with constipation.  Patient is taking MiraLAX and states that his chronic constipation is controlled and he is having regular bowel movements without difficulty.  He has had no rectal pain and no blood in the stool and no black stools.  No abdominal pain-no nausea/vomiting/diarrhea or constipation.           He reports that he is also taking Glucophage for prediabetes and he has had no issues with the medication.  He denies increased urinary frequency but does have issues with a dry mouth which may be associated with some of his other medications.  He does not really feel thirsty but feels that his mouth is dry.   Patient's major issue at this time consist of recurrent, random episodes of muscle cramping which can occur in the arms, chest or legs.  Patient has tried the use of mustard to help with the cramping and this does work but he wonders why he has frequent muscle cramping.  He has had no chest pain or palpitations, no shortness of breath or cough, no abdominal pain-no nausea/vomiting/diarrhea or constipation.  No urinary frequency.  Patient rarely has to awaken due to the need to urinate unless he drinks water late at night.  He has had no peripheral edema/no swelling in the lower legs.  No headaches or dizziness.  No lightheadedness, no focal numbness or weakness.  He denies any difficulty with sore throat and no issues with swallowing.   Past Medical History:  Diagnosis Date  . Depression   . Diverticulosis   . GERD (gastroesophageal reflux disease)   . Hx of adenomatous polyp of colon 08/25/2018  . Hypertension     Past Surgical History:  Procedure Laterality Date  . APPENDECTOMY    . COLON SURGERY     had complicated diverticulitis 2005  . left ring finger surgery      Family History  Problem Relation Age of Onset  . Stroke Mother   . Hypertension Mother   . Stroke Father   . Diabetes Father     Social History   Tobacco Use  . Smoking status: Current Every Day Smoker    Packs/day: 1.00    Types: Cigarettes  .  Smokeless tobacco: Former Systems developer  . Tobacco comment: 11-20 cigarettes a day  Substance Use Topics  . Alcohol use: Yes    Comment: occasionally  . Drug use: No     No Known Allergies     Observations/Objective: No vital signs or physical exam conducted as visit was done via telephone  Assessment and Plan: 1. Generalized anxiety disorder He reports improved control of anxiety with the use of Zoloft but did have some recent increased anxiety due to COVID-19.  Patient states that he is generally staying at home but does have regular communication with others/social  support by telephone.  He is compliant with the use of a mask and social distancing when outside of his home.  He feels that his anxiety is stable. - Comprehensive metabolic panel; Future  2. Gastroesophageal reflux disease, esophagitis presence not specified Patient has been asked to return for labs in follow-up of his acid reflux which he reports is currently controlled with the use of pantoprazole.  Patient will have CBC as well as CMP.  Patient has had GI follow-up.  Continue to avoid known trigger foods as well as avoidance of late night eating - CBC with Differential; Future  3. Muscle cramping Patient has complaint of recurrent muscle cramping without a known source.  Cramping can occur in multiple locations.  Patient has been asked to come in for CMP to check electrolyte status to make sure that he does not have any issues with potassium or other electrolytes which may be contributing to his symptoms.  Prescription provided for patient to try Robaxin as needed for muscle cramping and he may wish to take medication at bedtime as his symptoms are more likely to occur at this time. - Comprehensive metabolic panel; Future - methocarbamol (ROBAXIN) 500 MG tablet; Take 1 tablet (500 mg total) by mouth every 8 (eight) hours as needed for muscle spasms.  Dispense: 30 tablet; Refill: 3  4. Pre-diabetes He reports no issues with metformin use and no symptoms suggestive of diabetes.  He will continue metformin, low carbohydrate diet, exercise as tolerated and will come into lab to have hemoglobin A1c and CMP in follow-up of his prediabetes.- Comprehensive metabolic panel; Future - Hemoglobin A1c; Future  5. Tobacco use Continue use of nicotine patches with goal of eventual complete cessation of tobacco use.  Continue follow-up with clinical pharmacist.  6. CONSTIPATION, CHRONIC; History of colon polyps Symptoms are currently controlled with the use of MiraLAX.  Continue follow-up with  gastroenterology as indicated due to history of colon polyps with recent removal of polyp which was interfering with patient's ability to have bowel movements.  CBC at upcoming visit in follow-up of his chronic constipation and colon polyps to look for any GI blood loss.   8. Need for hepatitis C screening test; 9.  Screening for prostate cancer Preventative care gaps discussed with the patient and patient agrees to have blood work for screening for hepatitis C and his upcoming lab visit as well as PSA as a screening test for prostate cancer. - Hepatitis C antibody; Future - PSA; Future  10. Encounter for long-term (current) use of medications Patient will have complete/comprehensive metabolic panel and CBC at his upcoming visit in follow-up of long-term use of medications for treatment of multiple issues including diabetes, GERD and generalized anxiety disorder. - CBC with Differential; Future - Comprehensive metabolic panel; Future  Follow Up Instructions:Return in about 4 months (around 06/20/2019).  Sooner if any problems or concerns  I discussed the assessment and treatment plan with the patient. The patient was provided an opportunity to ask questions and all were answered. The patient agreed with the plan and demonstrated an understanding of the instructions.   The patient was advised to call back or seek an in-person evaluation if the symptoms worsen or if the condition fails to improve as anticipated.  I provided 18 minutes of non-face-to-face time during this encounter.   Antony Blackbird, MD

## 2019-02-22 ENCOUNTER — Other Ambulatory Visit: Payer: Self-pay

## 2019-02-22 ENCOUNTER — Ambulatory Visit: Payer: Self-pay | Attending: Family Medicine

## 2019-02-22 DIAGNOSIS — Z125 Encounter for screening for malignant neoplasm of prostate: Secondary | ICD-10-CM

## 2019-02-22 DIAGNOSIS — R7303 Prediabetes: Secondary | ICD-10-CM

## 2019-02-22 DIAGNOSIS — Z1159 Encounter for screening for other viral diseases: Secondary | ICD-10-CM

## 2019-02-22 DIAGNOSIS — Z79899 Other long term (current) drug therapy: Secondary | ICD-10-CM

## 2019-02-22 DIAGNOSIS — F411 Generalized anxiety disorder: Secondary | ICD-10-CM

## 2019-02-22 DIAGNOSIS — Z86010 Personal history of colonic polyps: Secondary | ICD-10-CM

## 2019-02-22 DIAGNOSIS — R252 Cramp and spasm: Secondary | ICD-10-CM

## 2019-02-22 DIAGNOSIS — K219 Gastro-esophageal reflux disease without esophagitis: Secondary | ICD-10-CM

## 2019-02-23 ENCOUNTER — Telehealth: Payer: Self-pay | Admitting: Family Medicine

## 2019-02-23 LAB — COMPREHENSIVE METABOLIC PANEL WITH GFR
ALT: 20 IU/L (ref 0–44)
AST: 16 IU/L (ref 0–40)
Albumin/Globulin Ratio: 1.6 (ref 1.2–2.2)
Albumin: 4.3 g/dL (ref 3.8–4.9)
Alkaline Phosphatase: 65 IU/L (ref 39–117)
BUN/Creatinine Ratio: 13 (ref 9–20)
BUN: 15 mg/dL (ref 6–24)
Bilirubin Total: 0.3 mg/dL (ref 0.0–1.2)
CO2: 20 mmol/L (ref 20–29)
Calcium: 9.5 mg/dL (ref 8.7–10.2)
Chloride: 102 mmol/L (ref 96–106)
Creatinine, Ser: 1.18 mg/dL (ref 0.76–1.27)
GFR calc Af Amer: 78 mL/min/1.73
GFR calc non Af Amer: 68 mL/min/1.73
Globulin, Total: 2.7 g/dL (ref 1.5–4.5)
Glucose: 103 mg/dL — ABNORMAL HIGH (ref 65–99)
Potassium: 4.9 mmol/L (ref 3.5–5.2)
Sodium: 140 mmol/L (ref 134–144)
Total Protein: 7 g/dL (ref 6.0–8.5)

## 2019-02-23 LAB — CBC WITH DIFFERENTIAL/PLATELET
Basophils Absolute: 0.1 x10E3/uL (ref 0.0–0.2)
Basos: 1 %
EOS (ABSOLUTE): 0.2 x10E3/uL (ref 0.0–0.4)
Eos: 3 %
Hematocrit: 45.2 % (ref 37.5–51.0)
Hemoglobin: 15.6 g/dL (ref 13.0–17.7)
Immature Grans (Abs): 0.1 x10E3/uL (ref 0.0–0.1)
Immature Granulocytes: 1 %
Lymphocytes Absolute: 2.6 x10E3/uL (ref 0.7–3.1)
Lymphs: 34 %
MCH: 32.5 pg (ref 26.6–33.0)
MCHC: 34.5 g/dL (ref 31.5–35.7)
MCV: 94 fL (ref 79–97)
Monocytes Absolute: 0.6 x10E3/uL (ref 0.1–0.9)
Monocytes: 8 %
Neutrophils Absolute: 4.2 x10E3/uL (ref 1.4–7.0)
Neutrophils: 53 %
Platelets: 241 x10E3/uL (ref 150–450)
RBC: 4.8 x10E6/uL (ref 4.14–5.80)
RDW: 13.2 % (ref 11.6–15.4)
WBC: 7.7 x10E3/uL (ref 3.4–10.8)

## 2019-02-23 LAB — HEMOGLOBIN A1C
Est. average glucose Bld gHb Est-mCnc: 131 mg/dL
Hgb A1c MFr Bld: 6.2 % — ABNORMAL HIGH (ref 4.8–5.6)

## 2019-02-23 LAB — PSA: Prostate Specific Ag, Serum: 0.2 ng/mL (ref 0.0–4.0)

## 2019-02-23 LAB — HEPATITIS C ANTIBODY: Hep C Virus Ab: <0.1 {s_co_ratio} (ref 0.0–0.9)

## 2019-02-23 NOTE — Telephone Encounter (Signed)
Pt was informed that an application was submitted to [facility] for June 05/2019 and it was denied since he was missing some documentation, he was given the billing dept. Number to call to try to find out what happen, Pt will auth for only OC and the RX card

## 2019-02-24 ENCOUNTER — Other Ambulatory Visit: Payer: Self-pay | Admitting: Family Medicine

## 2019-02-24 DIAGNOSIS — R7303 Prediabetes: Secondary | ICD-10-CM

## 2019-02-24 MED ORDER — METFORMIN HCL 500 MG PO TABS: ORAL_TABLET | ORAL | 3 refills | Status: AC

## 2019-02-24 NOTE — Progress Notes (Signed)
Patient ID: Jason Fritz, male   DOB: 1960-07-28, 58 y.o.   MRN: [id number]   Patient with recent blood work showing hemoglobin A1c which is increased from 5.811 months ago and is now at 6.2.  Patient was contacted with results and he does wish to start medication to help with his blood sugar control.  Prescription will be sent to patient's pharmacy for metformin 500 mg which patient will take once daily after the evening meal and if well tolerated he can increase to 2 pills once daily.  Patient has also been encouraged to make dietary changes.  Repeat A1c/BMP in 3-4 months.

## 2019-02-25 MED FILL — metFORMIN HCL 500 MG TABS: 500 | 30 days supply | Qty: 60 | Fill #0

## 2019-03-05 ENCOUNTER — Telehealth: Payer: Self-pay | Admitting: Family Medicine

## 2019-03-05 NOTE — Telephone Encounter (Signed)
Pt was informed that his CAFA application was denied

## 2019-03-18 ENCOUNTER — Ambulatory Visit: Payer: Self-pay | Admitting: Pharmacist

## 2019-04-12 ENCOUNTER — Other Ambulatory Visit: Payer: Self-pay | Admitting: Family Medicine

## 2019-04-12 DIAGNOSIS — K219 Gastro-esophageal reflux disease without esophagitis: Secondary | ICD-10-CM

## 2019-04-12 MED FILL — ?AMITRIPTYLINE HCL 25 MG TA: 25 | 30 days supply | Qty: 60 | Fill #2

## 2019-04-12 MED FILL — METHOCARBAMOL 500 MG TABS: 500 | 10 days supply | Qty: 30 | Fill #1

## 2019-04-12 MED FILL — PANTOPRAZOLE SOD DR 40 MG T: 40 | 30 days supply | Qty: 60 | Fill #0

## 2019-04-12 MED FILL — POLYETHYLENE GLYCOL 3350 PO: 17 | 30 days supply | Qty: 476 | Fill #2

## 2019-04-12 MED FILL — SERTRALINE HCL 100 MG TAB: 100 | 30 days supply | Qty: 60 | Fill #2

## 2019-04-16 ENCOUNTER — Ambulatory Visit: Payer: Self-pay | Admitting: Pharmacist

## 2019-06-17 ENCOUNTER — Other Ambulatory Visit: Payer: Self-pay | Admitting: Family Medicine

## 2019-06-17 DIAGNOSIS — J309 Allergic rhinitis, unspecified: Secondary | ICD-10-CM

## 2019-06-17 MED FILL — SERTRALINE HCL 100 MG TAB: 100 | 30 days supply | Qty: 60 | Fill #3

## 2019-06-17 MED FILL — POLYETHYLENE GLYCOL 3350 PO: 17 | 30 days supply | Qty: 476 | Fill #3

## 2019-06-17 MED FILL — METHOCARBAMOL 500 MG TABS: 500 | 10 days supply | Qty: 30 | Fill #2

## 2019-06-17 MED FILL — ?CETIRIZINE HCL 10 MG TABLE: 10 | 30 days supply | Qty: 30 | Fill #0

## 2019-06-17 MED FILL — PANTOPRAZOLE SOD DR 40 MG T: 40 | 30 days supply | Qty: 60 | Fill #1

## 2019-06-17 MED FILL — ?AMITRIPTYLINE HCL 25 MG TA: 25 | 30 days supply | Qty: 60 | Fill #3

## 2019-08-05 MED FILL — ?CETIRIZINE HCL 10 MG TABLE: 10 | 30 days supply | Qty: 30 | Fill #1

## 2019-08-05 MED FILL — METHOCARBAMOL 500 MG TABS: 500 | 10 days supply | Qty: 30 | Fill #3

## 2019-08-05 MED FILL — SERTRALINE HCL 100 MG TAB: 100 | 30 days supply | Qty: 60 | Fill #1

## 2020-01-03 ENCOUNTER — Other Ambulatory Visit: Payer: Self-pay | Admitting: Family Medicine

## 2020-01-03 DIAGNOSIS — F331 Major depressive disorder, recurrent, moderate: Secondary | ICD-10-CM

## 2020-01-03 DIAGNOSIS — F411 Generalized anxiety disorder: Secondary | ICD-10-CM

## 2020-01-03 DIAGNOSIS — F5104 Psychophysiologic insomnia: Secondary | ICD-10-CM

## 2020-01-03 MED FILL — ?CETIRIZINE HCL 10 MG TABLE: 10 | 30 days supply | Qty: 30 | Fill #2

## 2020-01-03 MED FILL — ?PANTOPRAZOLE SO DR 40MG TA: 40 | 30 days supply | Qty: 60 | Fill #2

## 2020-01-03 MED FILL — METFORMIN HCL 500 MG TABS: 500 | 30 days supply | Qty: 60 | Fill #0

## 2020-01-06 ENCOUNTER — Other Ambulatory Visit: Payer: Self-pay | Admitting: Family Medicine

## 2020-01-06 DIAGNOSIS — F411 Generalized anxiety disorder: Secondary | ICD-10-CM

## 2020-01-06 DIAGNOSIS — F5104 Psychophysiologic insomnia: Secondary | ICD-10-CM

## 2020-01-06 DIAGNOSIS — F331 Major depressive disorder, recurrent, moderate: Secondary | ICD-10-CM

## 2020-03-31 MED FILL — ?CETIRIZINE HCL 10 MG TABLE: 10 | 30 days supply | Qty: 30 | Fill #3

## 2020-05-12 IMAGING — DX DG SHOULDER 2+V*L*
2 series · 2 of 2 positions shown · non-contrast
Comparison: Chest x-ray dated [DATE] which included a
portion of the right shoulder.

CLINICAL DATA: Left-sided neck and shoulder pain with left arm and
hand numbness for the past 2 weeks. Unable to raise the left arm up
to do an axillary view. No known injury.

EXAM:
LEFT SHOULDER - 2+ VIEW

[w shoulder external left]
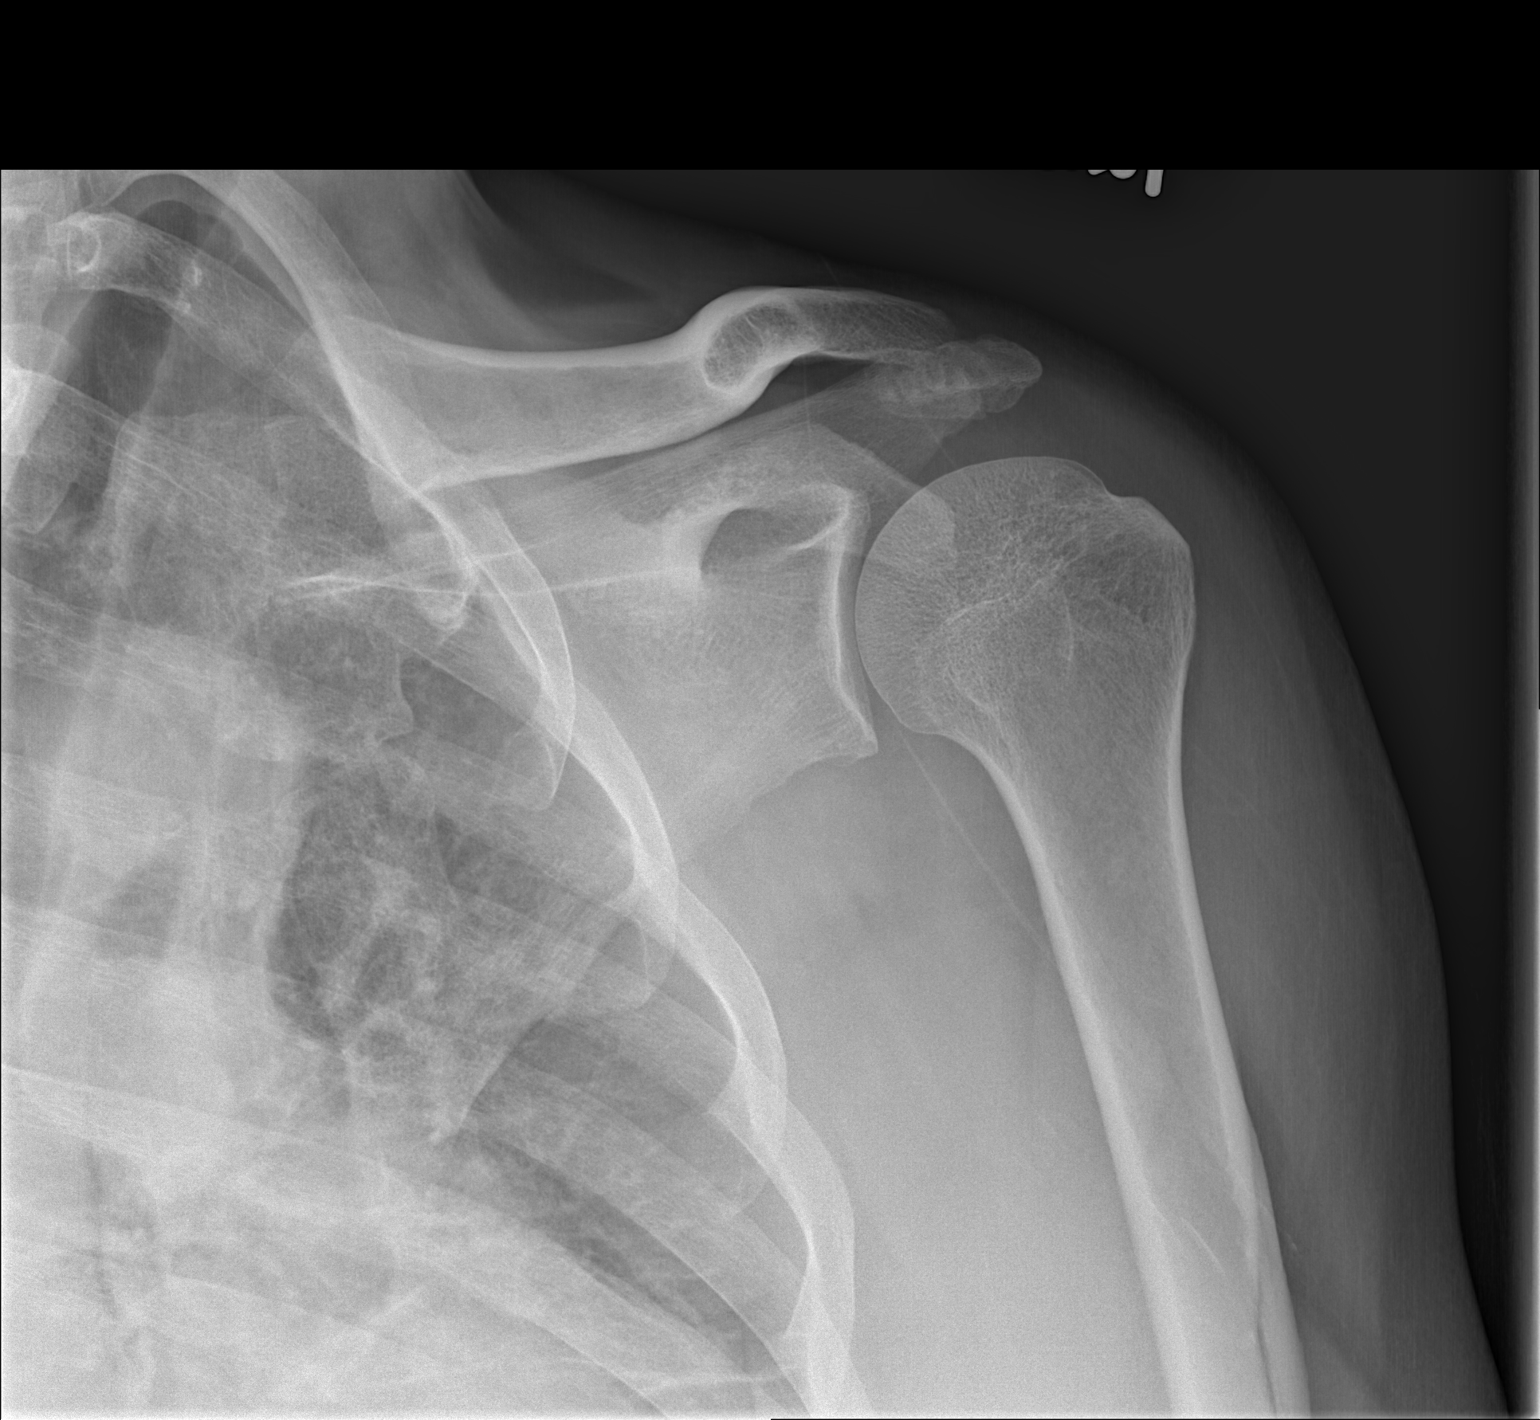

[w shoulder y-view left]
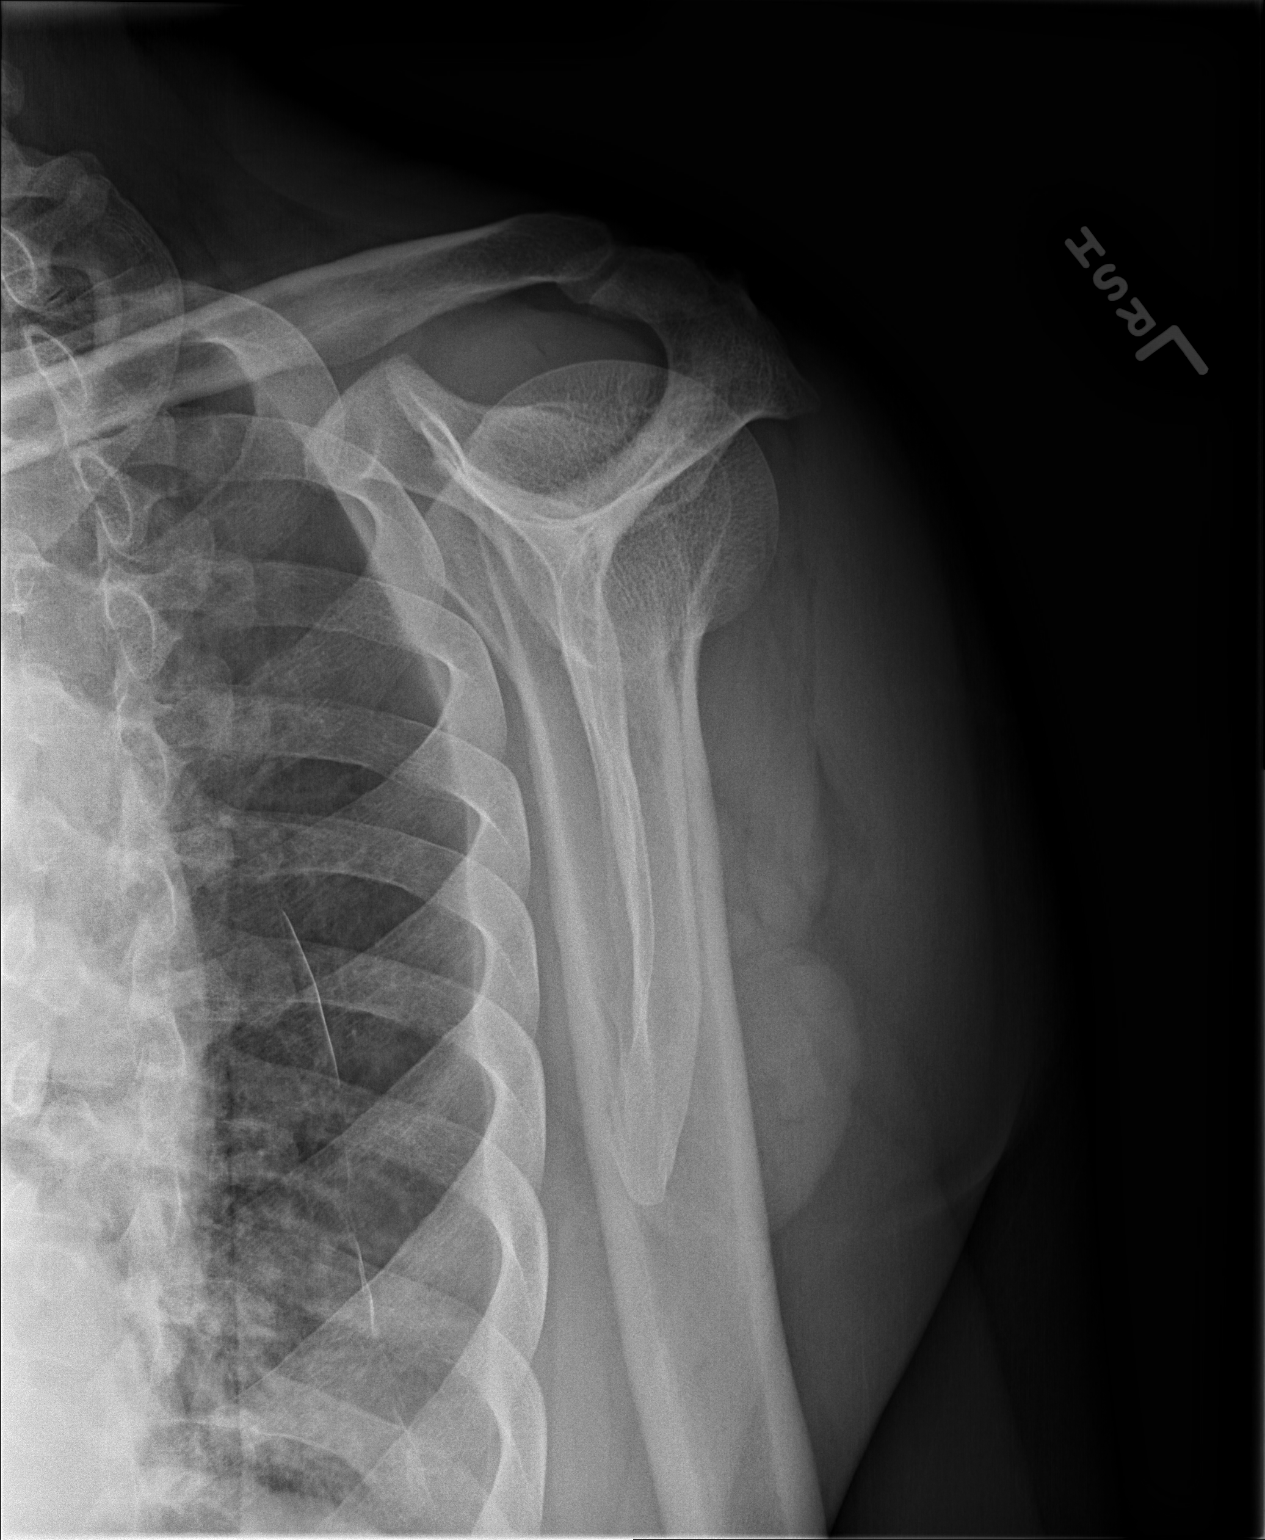

[2 of 2 positions shown; findings below may reference images not displayed]

FINDINGS: The bones are subjectively adequately mineralized. The glenohumeral
joint spaces reasonably well maintained as is the AC joint space.
The subacromial subdeltoid space is preserved. There is a
subacromial spur. No acute or healing fracture of the shoulder is
observed.
IMPRESSION: There is no acute or significant chronic bony abnormality of the
left shoulder. There is a small subacromial spur.

## 2021-07-24 ENCOUNTER — Telehealth: Payer: Self-pay | Admitting: *Deleted

## 2021-07-24 NOTE — Telephone Encounter (Signed)
Copied from [place] [phone]. Topic: General - Other >> Jul 20, 2021  9:46 AM Fields, Museum/gallery conservator R wrote: Reason for CRM: Butch Penny from [facility] home needs pcp to sign death certificate for pt but pcp no longer works at [employer], and needs information on who can sign death certificate. Seeking a call back at [phone] >> Jul 23, 2021 10:24 AM Tessa Lerner A wrote: Butch Penny with [facility] has made additional contact regarding this matter  The patient passed away on 07/10/2021 and completion of the death certificate is needed  Butch Penny can also be reached at [phone]

## 2021-07-24 NOTE — Telephone Encounter (Signed)
Pls advise. Last OV 2020

## 2021-07-25 NOTE — Telephone Encounter (Signed)
Called back Butch Penny to relay message per Dr. Margarita Rana regarding death certificate  Unable to reach, voicemail not set up.

## 2021-07-25 NOTE — Telephone Encounter (Signed)
I can sign it if it is forwarded to me today.  I will be out of the office starting tomorrow until Tuesday next week.
